# Patient Record
Sex: Female | Born: 1975
Health system: Southern US, Community
[De-identification: ages and names within clinical notes are randomized; demographics above are authoritative.]

## PROBLEM LIST (undated history)

## (undated) DIAGNOSIS — IMO0002 Reserved for concepts with insufficient information to code with codable children: Secondary | ICD-10-CM

## (undated) HISTORY — PX: TONSILLECTOMY AND ADENOIDECTOMY: SUR1326

## (undated) HISTORY — DX: Reserved for concepts with insufficient information to code with codable children: IMO0002

---

## 1998-01-01 ENCOUNTER — Encounter: Admission: RE | Admit: 1998-01-01 | Discharge: 1998-04-01 | Payer: Self-pay | Admitting: Gynecology

## 1998-04-03 ENCOUNTER — Inpatient Hospital Stay (HOSPITAL_COMMUNITY): Admission: AD | Admit: 1998-04-03 | Discharge: 1998-04-05 | Payer: Self-pay | Admitting: Gynecology

## 1998-08-21 ENCOUNTER — Emergency Department (HOSPITAL_COMMUNITY): Admission: EM | Admit: 1998-08-21 | Discharge: 1998-08-21 | Payer: Self-pay | Admitting: Emergency Medicine

## 1998-08-21 ENCOUNTER — Encounter: Payer: Self-pay | Admitting: Emergency Medicine

## 1999-08-13 ENCOUNTER — Inpatient Hospital Stay (HOSPITAL_COMMUNITY): Admission: AD | Admit: 1999-08-13 | Discharge: 1999-08-15 | Payer: Self-pay | Admitting: Gynecology

## 1999-08-16 ENCOUNTER — Encounter: Admission: RE | Admit: 1999-08-16 | Discharge: 1999-10-08 | Payer: Self-pay | Admitting: *Deleted

## 2000-11-15 ENCOUNTER — Other Ambulatory Visit: Admission: RE | Admit: 2000-11-15 | Discharge: 2000-11-15 | Payer: Self-pay | Admitting: Gynecology

## 2001-07-30 ENCOUNTER — Inpatient Hospital Stay (HOSPITAL_COMMUNITY): Admission: AD | Admit: 2001-07-30 | Discharge: 2001-08-02 | Payer: Self-pay | Admitting: Gynecology

## 2001-09-11 ENCOUNTER — Other Ambulatory Visit: Admission: RE | Admit: 2001-09-11 | Discharge: 2001-09-11 | Payer: Self-pay | Admitting: *Deleted

## 2002-11-20 ENCOUNTER — Other Ambulatory Visit: Admission: RE | Admit: 2002-11-20 | Discharge: 2002-11-20 | Payer: Self-pay | Admitting: Gynecology

## 2003-02-07 ENCOUNTER — Ambulatory Visit (HOSPITAL_COMMUNITY): Admission: RE | Admit: 2003-02-07 | Discharge: 2003-02-07 | Payer: Self-pay | Admitting: Gynecology

## 2003-02-07 ENCOUNTER — Ambulatory Visit (HOSPITAL_BASED_OUTPATIENT_CLINIC_OR_DEPARTMENT_OTHER): Admission: RE | Admit: 2003-02-07 | Discharge: 2003-02-07 | Payer: Self-pay | Admitting: Gynecology

## 2003-02-07 ENCOUNTER — Encounter (INDEPENDENT_AMBULATORY_CARE_PROVIDER_SITE_OTHER): Payer: Self-pay | Admitting: Specialist

## 2003-09-17 ENCOUNTER — Encounter: Admission: RE | Admit: 2003-09-17 | Discharge: 2003-12-16 | Payer: Self-pay | Admitting: Gynecology

## 2003-12-29 ENCOUNTER — Inpatient Hospital Stay (HOSPITAL_COMMUNITY): Admission: AD | Admit: 2003-12-29 | Discharge: 2003-12-31 | Payer: Self-pay | Admitting: Gynecology

## 2003-12-29 ENCOUNTER — Encounter (INDEPENDENT_AMBULATORY_CARE_PROVIDER_SITE_OTHER): Payer: Self-pay | Admitting: Specialist

## 2004-02-14 ENCOUNTER — Other Ambulatory Visit: Admission: RE | Admit: 2004-02-14 | Discharge: 2004-02-14 | Payer: Self-pay | Admitting: Gynecology

## 2005-03-15 ENCOUNTER — Other Ambulatory Visit: Admission: RE | Admit: 2005-03-15 | Discharge: 2005-03-15 | Payer: Self-pay | Admitting: Gynecology

## 2006-01-07 ENCOUNTER — Other Ambulatory Visit: Admission: RE | Admit: 2006-01-07 | Discharge: 2006-01-07 | Payer: Self-pay | Admitting: Gynecology

## 2007-01-10 ENCOUNTER — Other Ambulatory Visit: Admission: RE | Admit: 2007-01-10 | Discharge: 2007-01-10 | Payer: Self-pay | Admitting: Gynecology

## 2008-01-15 ENCOUNTER — Other Ambulatory Visit: Admission: RE | Admit: 2008-01-15 | Discharge: 2008-01-15 | Payer: Self-pay | Admitting: Gynecology

## 2008-01-15 ENCOUNTER — Encounter: Payer: Self-pay | Admitting: Gynecology

## 2008-01-15 ENCOUNTER — Ambulatory Visit: Payer: Self-pay | Admitting: Gynecology

## 2008-01-19 ENCOUNTER — Ambulatory Visit: Payer: Self-pay | Admitting: Gynecology

## 2009-01-15 ENCOUNTER — Other Ambulatory Visit: Admission: RE | Admit: 2009-01-15 | Discharge: 2009-01-15 | Payer: Self-pay | Admitting: Gynecology

## 2009-01-15 ENCOUNTER — Encounter: Payer: Self-pay | Admitting: Gynecology

## 2009-01-15 ENCOUNTER — Ambulatory Visit: Payer: Self-pay | Admitting: Gynecology

## 2009-01-22 ENCOUNTER — Ambulatory Visit (HOSPITAL_COMMUNITY): Admission: RE | Admit: 2009-01-22 | Discharge: 2009-01-22 | Payer: Self-pay | Admitting: Gynecology

## 2009-02-14 ENCOUNTER — Ambulatory Visit: Payer: Self-pay | Admitting: Gynecology

## 2009-03-14 ENCOUNTER — Ambulatory Visit: Payer: Self-pay | Admitting: Gynecology

## 2010-01-20 ENCOUNTER — Other Ambulatory Visit: Admission: RE | Admit: 2010-01-20 | Discharge: 2010-01-20 | Payer: Self-pay | Admitting: Gynecology

## 2010-01-20 ENCOUNTER — Ambulatory Visit: Payer: Self-pay | Admitting: Gynecology

## 2010-08-14 NOTE — Discharge Summary (Signed)
[facility]  Patient:    Paige Wise, Paige Wise                       MRN: [id number] Adm. Date:  [id number] Disc. Date: [id number] Attending:  Merrily Pew Dictator:   Antony Contras, RNC, [facility]                           Discharge Summary  DISCHARGE DIAGNOSES:          1. Intrauterine pregnancy at 39-5/7 weeks.                               2. Induction of labor.                               3. History of previous stillborn at 3 weeks.                               4. Second degree to probable cord accident.  PROCEDURE:                    Normal spontaneous vaginal delivery of a viable female infant, nuchal cord x 1, cut on perineum.  Repair of midline episiotomy with second degree extension.  HISTORY OF PRESENT ILLNESS:   The patient is a 35 year old, gravida 3, para 0-1-1, with an LMP of November 08, 1998, [facility] of Aug 15, 1999, by ultrasound.  Prenatal risk factors include history of a 28-week fetal loss secondary to cord accident with  previous pregnancy.  PRENATAL LABORATORY DATA:     Blood type O positive, antibody screen negative, rubella immuned.  RPR, HBSAG, HIV nonreactive.  MSAFP within normal limits. GBS was negative.  HOSPITAL COURSE:              The patient was admitted for induction of labor on Aug 13, 1998, currently at term with a favorable cervical dilatation and due to  history of stillborn, elected for an induction.  Cervix was 3 cm, 80% effaced, nd vertex was -3.  Labor progressed without difficulty to complete dilatation. She was delivered of an Apgars 8 and 8 female infant over a midline episiotomy with second degree laceration.  Weight was 7 pounds 7 ounces.  Nuchal cord x 1 tight  which was cut on the perineum.  Postpartum course, the patient remained afebrile, no difficulty voiding, and was able to be discharged on her second postpartum day in satisfactory condition. Postpartum CBC; hematocrit 39.1,  hemoglobin 13.9, WBC 20.3, platelets 153.  DISPOSITION:                  Follow up in the office in six weeks.  Continue with prenatal vitamins, iron, Motrin, and Tylox for pain. DD:  08/28/99 TD:  08/31/99 Job: [id number]

## 2010-08-14 NOTE — H&P (Signed)
NAME:  Paige Wise, Paige Wise                        ACCOUNT NO.:  [id number]   MEDICAL RECORD NO.:  [id number]                   PATIENT TYPE:  AMB   LOCATION:  [facility]                                 FACILITY:  [facility]   PHYSICIAN:  Juan H. Lily Peer, M.D.             DATE OF BIRTH:  [birth year]   DATE OF ADMISSION:  02/07/2003  DATE OF DISCHARGE:                                HISTORY & PHYSICAL   CHIEF COMPLAINT:  First trimester missed abortion.   HISTORY:  The patient is a 35 year old, gravida 4, para 3, living children  two, and one intrauterine fetal demise at [redacted] weeks gestation to cord  accident in 1999.  After that, she had two normal spontaneous vaginal  deliveries at term.  With one of the pregnancies, she had gestational  diabetes.  The patient was seen for an EOB visit on January 28, 2003, and  based on last menstrual period she would have been approximately 11-1/[redacted]  weeks gestation.  Fetal heart tones were not appreciated, so an ultrasound  was done which demonstrated an empty gestational sac, irregular in shape,  consistent with approximately 5 weeks and 6 days.  No cardiac activity was  noted or a yolk sac was seen.  She did have a quantitative beta hCG done on  that day which had a value of 36,840.  She had a quantitative beta hCG  repeated on January 31, 2003, which was only 27,866.  Today it was only  28,080.  She had another ultrasound done on January 31, 2003, and still  essentially no change and she wanted to have one more today to reassure  herself that she had a missed AB.  The ultrasound today demonstrated a  single gestational sac, irregular shape, no fetal pulse, a small yolk sac  was noted, a thickened endometrial cavity, irregular in shape, and an  echogenic hematoma surrounding the sac measuring 20 x 10 mm.  No cardiac  activity was noted.  The patient had denied any vaginal bleeding.  She is  scheduled to undergo a D&E for a missed AB.   ALLERGIES:  The patient  denies any allergies.   PAST MEDICAL HISTORY:  She had a normal spontaneous vaginal delivery in 1999  of a fetal demise at [redacted] weeks gestation secondary to cord accident.  In 2001  and 2003, she had normal spontaneous vaginal deliveries.  Aside from that,  she has had no major medical problems.  She recently had a hemoglobin A1C  due to the fact that she had gestational diabetes in prior pregnancy.  Her  hemoglobin A1C recently was normal.   PHYSICAL EXAMINATION:  GENERAL APPEARANCE:  A well-developed, well-nourished  female.  HEENT:  Unremarkable.  NECK:  Supple.  Trachea midline.  No carotid bruits.  No thyromegaly.  LUNGS:  Clear to auscultation without rhonchi or wheezes.  HEART:  Regular rate and rhythm.  No murmurs or gallops.  BREASTS:  Not done.  ABDOMEN:  Soft and nontender without rebound or guarding.  PELVIC:  Bartholin's, urethral, and Skene's glands within normal limits.  Vagina and cervix with no lesions or discharge.  The uterus was  approximately 6-8 weeks size with no palpable masses or tenderness.  RECTAL:  Deferred.   LABORATORY DATA:  Her prenatal labs had consisted of a blood type which was  O positive.  Negative antibody screen.  Her VDRL, hepatitis B surface  antigen, and HIV were negative.  Rubella with evidence of immunity.   ASSESSMENT:  A 35 year old, gravida 4, para 3, living children two, now  apparent missed AB.  Scheduled to undergo a D&E at the [facility] on February 07, 2003, at 9:30 a.m.  Please have the history and  physical available.  The patient was counseled as to the risks, benefits,  pros, and cons of D&E to include infection, bleeding, and trauma to internal  organs as a result of perforation during instrumentation requiring open  laparotomy.  In the event of uncontrollable hemorrhage, she is fully aware  that she may need a blood transfusion with its potential risks of  anaphylactic reaction, hepatitis, and AIDS and  additional hospitalization  days in the event that an open laparotomy may need to be utilized.  The  patient will receive prophylaxis antibiotic to prevent infection.  All of  the above was explained to the patient and we will follow according.   PLAN:  The patient is scheduled for surgery on February 07, 2003, at 9:30  a.m. for a D&E.                                               Juan H. Lily Peer, M.D.    JHF/MEDQ  D:  02/05/2003  T:  02/05/2003  Job:  [id number]

## 2010-08-14 NOTE — Op Note (Signed)
   NAME:  Paige Wise, CHICO                        ACCOUNT NO.:  [id number]   MEDICAL RECORD NO.:  [id number]                   PATIENT TYPE:  AMB   LOCATION:  [facility]                                 FACILITY:  [facility]   PHYSICIAN:  Juan H. Lily Peer, M.D.             DATE OF BIRTH:  09/15/1975   DATE OF PROCEDURE:  02/07/2003  DATE OF DISCHARGE:                                 OPERATIVE REPORT   INDICATIONS FOR PROCEDURE:  A 35 year old, gravida 4, para 3, living  children 2, one intrauterine fetal demise at [redacted] weeks gestation due to a  cord accident in 1999. She has had two normal spontaneous vaginal deliveries  at term and now with this pregnancy first trimester missed AB.   PREOPERATIVE DIAGNOSIS:  First trimester missed abortion.   POSTOPERATIVE DIAGNOSIS:  First trimester missed abortion.   ANESTHESIA:  General endotracheal anesthesia.   SURGEON:  Juan H. Lily Peer, M.D.   PROCEDURE:  Suction D&E.   DESCRIPTION OF PROCEDURE:  After the patient was adequately counseled, she  was taken to the operating room where she underwent a successful general  endotracheal anesthesia. She received a gram of Cefotan prophylactically.  The vagina and perineum were prepped and draped in the usual sterile  fashion. A red rubber Roxan Hockey was utilized to evacuate the bladder, his  contents were less than 50 mL. Bimanual examination demonstrated a uterus  approximately 8-10 weeks size, no palpable adnexal masses. A Graves speculum  was inserted into the vaginal vault. The cervix was grasped with a single  tooth tenaculum and the cervix was serially dilated with Shawnie Pons dilators in  an effort to allow an 8 mm suction curette to be introduced into the  intrauterine cavity to remove the products of conception. This was  interchanged with a small serrated curette to remove the products of  conception. The single tooth tenaculum was removed, silver nitrate was used  for hemostasis at the puncture site from  the single tooth tenaculum. The  patient was extubated, transferred to the recovery room with stable vital  signs. She did receive 10 units of Pitocin per 1 liter of lactated Ringers  and she will receive 30 mg of Toradol IV in route to the recovery room. Her  blood type is O positive.                                               Juan H. Lily Peer, M.D.    JHF/MEDQ  D:  02/07/2003  T:  02/07/2003  Job:  [id number]

## 2010-08-14 NOTE — Discharge Summary (Signed)
[facility]  Patient:    Paige Wise, MARXEN Visit Number: [id number] MRN: [id number]          Service Type: OBS Location: 910A 9146 01 Attending Physician:  Wetzel Bjornstad Dictated by:   Antony Contras, [facility] Admit Date:  07/30/2001 Discharge Date: 08/02/2001                             Discharge Summary  DISCHARGE DIAGNOSES:          1. Intrauterine pregnancy at term.                               2. Spontaneous onset of labor.                               3. Inefficient pushing.  PROCEDURE:                    Outlet vacuum-assisted vaginal delivery with delivery of a viable infant.  HISTORY OF PRESENT ILLNESS:   The patient is a 35 year old gravida 3, para 1-0-1-1, LMP October 28, 2000, [facility] Aug 04, 2001.  Prenatal risk factors include previous history of intrauterine fetal demise at 28 weeks first pregnancy.  LABORATORY DATA:              Blood type O+, antibody screen negative.  RPR, HBsAg, HIV nonreactive.  HOSPITAL COURSE AND TREATMENT:                    The patient was admitted at [redacted] weeks gestation with spontaneous onset of labor.  She progressed to complete dilatation.  Due to ineffective pushing, delivery was accomplished outlet vacuum assistance. Delivered an Apgar 23 and 47 female infant weighing 7 pounds and 10 ounces over an intact perineum with repair of second-degree laceration.  Postpartum course, she remained afebrile.  She had no difficulty voiding.  She was able to be discharged in satisfactory condition on her second postpartum day.  CBC: Hematocrit 33.4, hemoglobin 11.3, WBC 13.1, platelets 156.  DISPOSITION:                  Follow up in six weeks.  Continue with prenatal vitamins with iron and Motrin for pain. Dictated by:   Antony Contras, [facility] Attending Physician:  Wetzel Bjornstad DD:  08/17/01 TD:  08/20/01 Job: [id number]

## 2011-01-21 ENCOUNTER — Encounter: Payer: Self-pay | Admitting: Anesthesiology

## 2011-01-27 ENCOUNTER — Ambulatory Visit (INDEPENDENT_AMBULATORY_CARE_PROVIDER_SITE_OTHER): Payer: Managed Care, Other (non HMO) | Admitting: Gynecology

## 2011-01-27 ENCOUNTER — Other Ambulatory Visit (HOSPITAL_COMMUNITY)
Admission: RE | Admit: 2011-01-27 | Discharge: 2011-01-27 | Disposition: A | Discharge status: Home / Self Care | Payer: Managed Care, Other (non HMO) | Source: Ambulatory Visit | Attending: Gynecology | Admitting: Gynecology

## 2011-01-27 ENCOUNTER — Encounter: Payer: Self-pay | Admitting: Gynecology

## 2011-01-27 VITALS — BP 110/72 | Ht 59.25 in | Wt 134.0 lb

## 2011-01-27 DIAGNOSIS — Z8632 Personal history of gestational diabetes: Secondary | ICD-10-CM

## 2011-01-27 DIAGNOSIS — R82998 Other abnormal findings in urine: Secondary | ICD-10-CM

## 2011-01-27 DIAGNOSIS — Z01419 Encounter for gynecological examination (general) (routine) without abnormal findings: Secondary | ICD-10-CM | POA: Insufficient documentation

## 2011-01-27 DIAGNOSIS — R635 Abnormal weight gain: Secondary | ICD-10-CM

## 2011-01-27 NOTE — Progress Notes (Signed)
Paige Wise [birth year] [id number]   History:    35 y.o.  for annual exam with no complaints today. Patient with prior history of gestational diabetes. Patient has a ParaGard T380A IUD. Patient is having normal menstrual cycles. Patient does her monthly self breast examinations. Her last mammogram was in 2010 which was normal.  Past medical history,surgical history, family history and social history were all reviewed and documented in the EPIC chart.  ROS:  Was performed and pertinent positives and negatives are included in the history.  Exam: chaperone present BP 110/72  Ht 4' 11.25" (1.505 m)  Wt 134 lb (60.782 kg)  BMI 26.84 kg/m2  LMP 01/21/2011  Body mass index is 26.84 kg/(m^2).  General appearance : Well developed well nourished female. No acute distress HEENT: Neck supple, trachea midline, no carotid bruits, no thyroidmegaly Lungs: Clear to auscultation, no rhonchi or wheezes, or rib retractions  Heart: Regular rate and rhythm, no murmurs or gallops Breast:Examined in sitting and supine position were symmetrical in appearance, no palpable masses or tenderness,  no skin retraction, no nipple inversion, no nipple discharge, no skin discoloration, no axillary or supraclavicular lymphadenopathy Abdomen: no palpable masses or tenderness, no rebound or guarding Extremities: no edema or skin discoloration or tenderness  Pelvic:  Bartholin, Urethra, Skene Glands: Within normal limits             Vagina: No gross lesions or discharge  Cervix: No gross lesions or discharge IUD string seen  Uterus  anteverted, normal size, shape and consistency, non-tender and mobile  Adnexa  Without masses or tenderness  Anus and perineum  normal   Rectovaginal  normal sphincter tone without palpated masses or tenderness             Hemoccult not done     Assessment/Plan:  35 y.o. female for annual exam unremarkable. The following labs were drawn today CBC, cholesterol, urinalysis, TSH, random  blood sugar and Pap smear. She was instructed to continue to do her monthly self breast examination to take calcium and vitamin D for osteoporosis prevention engage in exercise program 3 or 4 times a week. We'll see her in one year or when necessary.    Ok Edwards MD, 10:58 AM 01/27/2011

## 2011-01-28 ENCOUNTER — Other Ambulatory Visit: Payer: Self-pay | Admitting: *Deleted

## 2011-01-28 DIAGNOSIS — R8271 Bacteriuria: Secondary | ICD-10-CM

## 2011-01-29 ENCOUNTER — Ambulatory Visit (INDEPENDENT_AMBULATORY_CARE_PROVIDER_SITE_OTHER): Payer: Managed Care, Other (non HMO) | Admitting: Gynecology

## 2011-01-29 DIAGNOSIS — R8271 Bacteriuria: Secondary | ICD-10-CM

## 2011-01-29 DIAGNOSIS — R82998 Other abnormal findings in urine: Secondary | ICD-10-CM

## 2011-02-02 ENCOUNTER — Other Ambulatory Visit: Payer: Self-pay | Admitting: *Deleted

## 2011-02-02 DIAGNOSIS — N390 Urinary tract infection, site not specified: Secondary | ICD-10-CM

## 2011-02-02 MED ORDER — NITROFURANTOIN MONOHYD MACRO 100 MG PO CAPS: 100.0000 mg | ORAL_CAPSULE | Freq: Two times a day (BID) | ORAL | Status: AC

## 2012-01-31 ENCOUNTER — Encounter: Payer: Managed Care, Other (non HMO) | Admitting: Gynecology

## 2012-02-02 ENCOUNTER — Ambulatory Visit (INDEPENDENT_AMBULATORY_CARE_PROVIDER_SITE_OTHER): Payer: Managed Care, Other (non HMO) | Admitting: Gynecology

## 2012-02-02 ENCOUNTER — Encounter: Payer: Self-pay | Admitting: Gynecology

## 2012-02-02 VITALS — BP 116/78 | Ht 59.25 in | Wt 136.0 lb

## 2012-02-02 DIAGNOSIS — Z23 Encounter for immunization: Secondary | ICD-10-CM

## 2012-02-02 DIAGNOSIS — R635 Abnormal weight gain: Secondary | ICD-10-CM | POA: Insufficient documentation

## 2012-02-02 DIAGNOSIS — Z01419 Encounter for gynecological examination (general) (routine) without abnormal findings: Secondary | ICD-10-CM

## 2012-02-02 DIAGNOSIS — Z8632 Personal history of gestational diabetes: Secondary | ICD-10-CM

## 2012-02-02 LAB — CBC WITH DIFFERENTIAL/PLATELET
Basophils Absolute: 0 10*3/uL (ref 0.0–0.1)
Basophils Relative: 0 % (ref 0–1)
HCT: 40.6 % (ref 36.0–46.0)
MCHC: 33.5 g/dL (ref 30.0–36.0)
Monocytes Absolute: 0.7 10*3/uL (ref 0.1–1.0)
Neutro Abs: 6.1 10*3/uL (ref 1.7–7.7)
Platelets: 251 10*3/uL (ref 150–400)
RDW: 13.3 % (ref 11.5–15.5)
WBC: 9.2 10*3/uL (ref 4.0–10.5)

## 2012-02-02 LAB — HEMOGLOBIN A1C: Mean Plasma Glucose: 114 mg/dL (ref <117)

## 2012-02-02 NOTE — Patient Instructions (Signed)

## 2012-02-02 NOTE — Progress Notes (Signed)
Paige Wise 07/19/1975 [id number]   History:    36 y.o.  for annual gyn exam with no complaints today. Review of patient's records indicated she has history of gestational diabetes in the past. She has a ParaGard T380A IUD that was placed in November 2010. Her cycles are reported to be normal. She does her monthly self breast examination. Her Pap smears have always been normal the last one being in 2012.  Past medical history,surgical history, family history and social history were all reviewed and documented in the EPIC chart.  Gynecologic History Patient's last menstrual period was 01/07/2012. Contraception: IUD Last Pap: 2012. Results were: normal Last mammogram: Not indicated. Results were: Not indicated  Obstetric History OB History    Grav Para Term Preterm Abortions TAB SAB Ect Mult Living   5 3 3  2  2   3      # Outc Date GA Lbr Len/2nd Wgt Sex Del Anes PTL Lv   1 TRM     M SVD   Yes   2 TRM     F SVD   Yes   3 TRM     F SVD   Yes   4 SAB            5 SAB                ROS: A ROS was performed and pertinent positives and negatives are included in the history.  GENERAL: No fevers or chills. HEENT: No change in vision, no earache, sore throat or sinus congestion. NECK: No pain or stiffness. CARDIOVASCULAR: No chest pain or pressure. No palpitations. PULMONARY: No shortness of breath, cough or wheeze. GASTROINTESTINAL: No abdominal pain, nausea, vomiting or diarrhea, melena or bright red blood per rectum. GENITOURINARY: No urinary frequency, urgency, hesitancy or dysuria. MUSCULOSKELETAL: No joint or muscle pain, no back pain, no recent trauma. DERMATOLOGIC: No rash, no itching, no lesions. ENDOCRINE: No polyuria, polydipsia, no heat or cold intolerance. No recent change in weight. HEMATOLOGICAL: No anemia or easy bruising or bleeding. NEUROLOGIC: No headache, seizures, numbness, tingling or weakness. PSYCHIATRIC: No depression, no loss of interest in normal activity or change  in sleep pattern.     Exam: chaperone present  BP 116/78  Ht 4' 11.25" (1.505 m)  Wt 136 lb (61.689 kg)  BMI 27.24 kg/m2  LMP 01/07/2012  Body mass index is 27.24 kg/(m^2).  General appearance : Well developed well nourished female. No acute distress HEENT: Neck supple, trachea midline, no carotid bruits, no thyroidmegaly Lungs: Clear to auscultation, no rhonchi or wheezes, or rib retractions  Heart: Regular rate and rhythm, no murmurs or gallops Breast:Examined in sitting and supine position were symmetrical in appearance, no palpable masses or tenderness,  no skin retraction, no nipple inversion, no nipple discharge, no skin discoloration, no axillary or supraclavicular lymphadenopathy Abdomen: no palpable masses or tenderness, no rebound or guarding Extremities: no edema or skin discoloration or tenderness  Pelvic:  Bartholin, Urethra, Skene Glands: Within normal limits             Vagina: No gross lesions or discharge  Cervix: No gross lesions or discharge  Uterus  anteverted, normal size, shape and consistency, non-tender and mobile  Adnexa  Without masses or tenderness  Anus and perineum  normal   Rectovaginal  normal sphincter tone without palpated masses or tenderness             Hemoccult not done     Assessment/Plan:  36 y.o. female for annual exam with no abnormalities detected. We discussed the new Pap smear screening guidelines. No Pap smear done today. The following labs were ordered today: Hemoglobin A1c, cholesterol, CBC, urinalysis and TSH. She was to receive the flu vaccine in the Tdap vaccine. She was counseled and literature information was provided. She was reminded to do her monthly self breast examination. We discussed importance of calcium vitamin D for osteoporosis prevention.    Ok Edwards MD, 11:09 AM 02/02/2012

## 2012-02-03 LAB — URINALYSIS W MICROSCOPIC + REFLEX CULTURE
Bacteria, UA: NONE SEEN
Casts: NONE SEEN
Glucose, UA: NEGATIVE mg/dL
Hgb urine dipstick: NEGATIVE
Ketones, ur: NEGATIVE mg/dL
Nitrite: NEGATIVE
Protein, ur: NEGATIVE mg/dL
pH: 7.5 (ref 5.0–8.0)

## 2012-02-03 LAB — TSH: TSH: 1.898 u[IU]/mL (ref 0.350–4.500)

## 2012-02-03 LAB — CHOLESTEROL, TOTAL: Cholesterol: 121 mg/dL (ref 0–200)

## 2012-02-05 LAB — URINE CULTURE: Colony Count: >=100000

## 2012-02-09 ENCOUNTER — Other Ambulatory Visit: Payer: Self-pay | Admitting: Gynecology

## 2012-02-09 MED ORDER — NITROFURANTOIN MONOHYD MACRO 100 MG PO CAPS: 100.0000 mg | ORAL_CAPSULE | Freq: Two times a day (BID) | ORAL | Status: DC

## 2012-12-28 ENCOUNTER — Ambulatory Visit (INDEPENDENT_AMBULATORY_CARE_PROVIDER_SITE_OTHER): Payer: Managed Care, Other (non HMO) | Admitting: Anesthesiology

## 2012-12-28 DIAGNOSIS — Z23 Encounter for immunization: Secondary | ICD-10-CM

## 2013-02-05 ENCOUNTER — Encounter: Payer: Self-pay | Admitting: Gynecology

## 2013-02-05 ENCOUNTER — Ambulatory Visit (INDEPENDENT_AMBULATORY_CARE_PROVIDER_SITE_OTHER): Payer: Commercial Managed Care - PPO | Admitting: Gynecology

## 2013-02-05 VITALS — BP 120/74 | Ht 59.5 in | Wt 131.0 lb

## 2013-02-05 DIAGNOSIS — Z01419 Encounter for gynecological examination (general) (routine) without abnormal findings: Secondary | ICD-10-CM

## 2013-02-05 LAB — CBC WITH DIFFERENTIAL/PLATELET
Basophils Absolute: 0 10*3/uL (ref 0.0–0.1)
Basophils Relative: 1 % (ref 0–1)
Eosinophils Absolute: 0.1 10*3/uL (ref 0.0–0.7)
MCH: 29.6 pg (ref 26.0–34.0)
MCHC: 34.1 g/dL (ref 30.0–36.0)
Neutrophils Relative %: 57 % (ref 43–77)
Platelets: 217 10*3/uL (ref 150–400)
RDW: 13.6 % (ref 11.5–15.5)

## 2013-02-05 LAB — COMPREHENSIVE METABOLIC PANEL
ALT: 16 U/L (ref 0–35)
Alkaline Phosphatase: 53 U/L (ref 39–117)
Glucose, Bld: 77 mg/dL (ref 70–99)
Sodium: 139 mEq/L (ref 135–145)
Total Bilirubin: 0.5 mg/dL (ref 0.3–1.2)
Total Protein: 6.7 g/dL (ref 6.0–8.3)

## 2013-02-05 LAB — TSH: TSH: 2.155 u[IU]/mL (ref 0.350–4.500)

## 2013-02-05 LAB — CHOLESTEROL, TOTAL: Cholesterol: 104 mg/dL (ref 0–200)

## 2013-02-05 NOTE — Progress Notes (Signed)
Paige Wise Aug 09, 1975 [id number]   History:    37 y.o.  for annual gyn exam with no complaints today. Patient has a ParaGard T380A IUD placement 2010. Patient would know prior history of abnormal Pap smear. Patient reports normal menstrual cycles. Patient had flu vaccine this year. Patient with past history of gestational diabetes.  Past medical history,surgical history, family history and social history were all reviewed and documented in the EPIC chart.  Gynecologic History Patient's last menstrual period was 01/20/2013. Contraception: IUD Last Pap: 2012. Results were: normal Last mammogram: none indicated. Results were: none indicated  Obstetric History OB History  Gravida Para Term Preterm AB SAB TAB Ectopic Multiple Living  5 3 3  2 2    3     # Outcome Date GA Lbr Len/2nd Weight Sex Delivery Anes PTL Lv  5 SAB           4 SAB           3 TRM     F SVD   Y  2 TRM     F SVD   Y  1 TRM     M SVD   Y       ROS: A ROS was performed and pertinent positives and negatives are included in the history.  GENERAL: No fevers or chills. HEENT: No change in vision, no earache, sore throat or sinus congestion. NECK: No pain or stiffness. CARDIOVASCULAR: No chest pain or pressure. No palpitations. PULMONARY: No shortness of breath, cough or wheeze. GASTROINTESTINAL: No abdominal pain, nausea, vomiting or diarrhea, melena or bright red blood per rectum. GENITOURINARY: No urinary frequency, urgency, hesitancy or dysuria. MUSCULOSKELETAL: No joint or muscle pain, no back pain, no recent trauma. DERMATOLOGIC: No rash, no itching, no lesions. ENDOCRINE: No polyuria, polydipsia, no heat or cold intolerance. No recent change in weight. HEMATOLOGICAL: No anemia or easy bruising or bleeding. NEUROLOGIC: No headache, seizures, numbness, tingling or weakness. PSYCHIATRIC: No depression, no loss of interest in normal activity or change in sleep pattern.     Exam: chaperone present  BP 120/74  Ht 4'  11.5" (1.511 m)  Wt 131 lb (59.421 kg)  BMI 26.03 kg/m2  LMP 01/20/2013  Body mass index is 26.03 kg/(m^2).  General appearance : Well developed well nourished female. No acute distress HEENT: Neck supple, trachea midline, no carotid bruits, no thyroidmegaly Lungs: Clear to auscultation, no rhonchi or wheezes, or rib retractions  Heart: Regular rate and rhythm, no murmurs or gallops Breast:Examined in sitting and supine position were symmetrical in appearance, no palpable masses or tenderness,  no skin retraction, no nipple inversion, no nipple discharge, no skin discoloration, no axillary or supraclavicular lymphadenopathy Abdomen: no palpable masses or tenderness, no rebound or guarding Extremities: no edema or skin discoloration or tenderness  Pelvic:  Bartholin, Urethra, Skene Glands: Within normal limits             Vagina: No gross lesions or discharge  Cervix: No gross lesions or discharge, IUD string seen  Uterus  anteverted, normal size, shape and consistency, non-tender and mobile  Adnexa  Without masses or tenderness  Anus and perineum  normal   Rectovaginal  normal sphincter tone without palpated masses or tenderness             Hemoccult none indicated     Assessment/Plan:  37 y.o. female for annual exam with no abnormalities detected. Patient doing well with the ParaGard T380A IUD. Patient had flu vaccine this  year. Pap smear was not done today in accordance with a new guidelines. The following labs were ordered: CBC, comprehensive metabolic panel, TSH, screening cholesterol, and urinalysis.  Note: This dictation was prepared with  Dragon/digital dictation along withSmart phrase technology. Any transcriptional errors that result from this process are unintentional.   Ok Edwards MD, 11:15 AM 02/05/2013

## 2013-02-06 LAB — URINALYSIS W MICROSCOPIC + REFLEX CULTURE
Bilirubin Urine: NEGATIVE
Ketones, ur: NEGATIVE mg/dL
Nitrite: NEGATIVE
Protein, ur: NEGATIVE mg/dL
Urobilinogen, UA: 0.2 mg/dL (ref 0.0–1.0)
pH: 7.5 (ref 5.0–8.0)

## 2013-02-08 ENCOUNTER — Other Ambulatory Visit: Payer: Self-pay | Admitting: Gynecology

## 2013-02-08 LAB — URINE CULTURE

## 2013-02-08 MED ORDER — NITROFURANTOIN MONOHYD MACRO 100 MG PO CAPS: 100.0000 mg | ORAL_CAPSULE | Freq: Two times a day (BID) | ORAL | Status: DC

## 2013-05-31 ENCOUNTER — Ambulatory Visit: Payer: Commercial Managed Care - PPO | Admitting: Gynecology

## 2013-06-01 ENCOUNTER — Encounter: Payer: Self-pay | Admitting: Gynecology

## 2013-06-01 ENCOUNTER — Ambulatory Visit (INDEPENDENT_AMBULATORY_CARE_PROVIDER_SITE_OTHER): Payer: Commercial Managed Care - PPO | Admitting: Gynecology

## 2013-06-01 VITALS — BP 124/76

## 2013-06-01 DIAGNOSIS — N644 Mastodynia: Secondary | ICD-10-CM

## 2013-06-01 NOTE — Progress Notes (Signed)
   Patient is a 38 year old who presented to the office today complaining of left breast tenderness which she states has occurred over the past 2 months the week before her menses. Patient denies any nipple discharge or any masses. She denies any recent injury. She does drink one cup T. daily. Patient with no family history of breast cancer. Patient had a normal baseline mammogram at the age of 38.  Exam: Both breasts were examined in the sitting and supine positions. Both breasts are symmetrical in appearance. No skin discoloration no palpable masses or tenderness and no supraclavicular or axillary lymphadenopathy.  Assessment/plan: Premenstrual mastodynia. Will proceed with scheduling her screening mammogram since she is now near age 38. I have offered her to take over-the-counter vitamin E 600 units daily and to eliminate any caffeine-containing products the week before her menses. Patient scheduled to return back at the end of the year for her annual exam or when necessary.

## 2013-06-01 NOTE — Patient Instructions (Signed)
Sensibilidad en las mamas  (Breast Tenderness)  La sensibilidad en las mamas es un problema frecuente en las mujeres de todas las edades. y puede causar molestias leves o dolor intenso. Sus causas son variadas. Su mdico determinar la causa probable de la sensibilidad mediante el examen de las mamas, las preguntas sobre los sntomas y la indicacin de algunos estudios. Por lo general, la sensibilidad en las mamas no significa que tenga cncer de mama.  INSTRUCCIONES PARA EL CUIDADO EN EL HOGAR   A menudo, la sensibilidad en las mamas puede tratarse en el hogar. Puede intentar lo siguiente:   Probarse un nuevo sostn que le brinde ms sujecin, especialmente mientras hace actividad fsica.   Usar un sostn con mejor sujecin o uno deportivo mientras duerme cuando las mamas estn muy sensibles.   Si tiene una lesin mamaria, aplique hielo en la zona:   Ponga el hielo en una bolsa plstica.   Colquese una toalla entre la piel y la bolsa de hielo.   Deje el hielo durante 20 minutos y aplquelo 2 a 3 veces por da.   Si tiene las mamas repletas de leche debido a la lactancia, intente lo siguiente:   Extrigase leche manualmente o con un sacaleche.   Aplquese una compresa tibia en las mamas para ayudar a la descarga.   Tome analgsicos de venta libre si su mdico lo autoriza.   Tome otros medicamentos que su mdico le recete, entre ellos, antibiticos o anticonceptivos.  A largo plazo, puede aliviar la sensibilidad en las mamas si hace lo siguiente:   Disminuye el consumo de cafena.   Disminuye la cantidad de grasa de la dieta.  Lleva un registro de los das y las horas cuando tiene mayor sensibilidad en las mamas. Esto ser de ayuda para que usted y su mdico encuentren la causa de la sensibilidad y cmo aliviarla. Adems, aprenda cmo examinarse las mamas en casa. Esto la ayudar a palpar un crecimiento o un bulto fuera de lo normal que podra causar la sensibilidad.  SOLICITE ATENCIN MDICA SI:     Cualquier zona de la mama est dura, enrojecida y caliente al tacto. Puede ser un signo de infeccin.   Hay secrecin de los pezones (y no est amamantando). En especial, vigile la secrecin de sangre o pus.   Tiene fiebre, adems de sensibilidad en las mamas.   Tiene un bulto nuevo o doloroso en la mama que no desaparece despus de la finalizacin del perodo menstrual.   Ha intentando controlar el dolor en casa, pero no desaparece.   El dolor de la mama es ms intenso o le dificulta hacer las cosas que hace habitualmente durante el da.  Document Released: 01/03/2013  [employer] Patient Information 2014 [employer].

## 2013-10-29 ENCOUNTER — Other Ambulatory Visit: Payer: Self-pay | Admitting: Gynecology

## 2013-10-29 DIAGNOSIS — Z1231 Encounter for screening mammogram for malignant neoplasm of breast: Secondary | ICD-10-CM

## 2013-11-02 ENCOUNTER — Ambulatory Visit (HOSPITAL_COMMUNITY)
Admission: RE | Admit: 2013-11-02 | Discharge: 2013-11-02 | Disposition: A | Discharge status: Home / Self Care | Payer: Managed Care, Other (non HMO) | Source: Ambulatory Visit | Attending: Gynecology | Admitting: Gynecology

## 2013-11-02 DIAGNOSIS — Z1231 Encounter for screening mammogram for malignant neoplasm of breast: Secondary | ICD-10-CM

## 2014-01-28 ENCOUNTER — Encounter: Payer: Self-pay | Admitting: Gynecology

## 2014-04-03 ENCOUNTER — Ambulatory Visit (INDEPENDENT_AMBULATORY_CARE_PROVIDER_SITE_OTHER): Payer: Medicaid Other | Admitting: Gynecology

## 2014-04-03 ENCOUNTER — Encounter: Payer: Self-pay | Admitting: Gynecology

## 2014-04-03 ENCOUNTER — Other Ambulatory Visit (HOSPITAL_COMMUNITY)
Admission: RE | Admit: 2014-04-03 | Discharge: 2014-04-03 | Disposition: A | Discharge status: Home / Self Care | Payer: Managed Care, Other (non HMO) | Source: Ambulatory Visit | Attending: Gynecology | Admitting: Gynecology

## 2014-04-03 VITALS — BP 122/80 | Ht 59.75 in | Wt 137.0 lb

## 2014-04-03 DIAGNOSIS — Z01419 Encounter for gynecological examination (general) (routine) without abnormal findings: Secondary | ICD-10-CM

## 2014-04-03 DIAGNOSIS — Z309 Encounter for contraceptive management, unspecified: Secondary | ICD-10-CM | POA: Diagnosis not present

## 2014-04-03 DIAGNOSIS — Z1151 Encounter for screening for human papillomavirus (HPV): Secondary | ICD-10-CM | POA: Insufficient documentation

## 2014-04-03 NOTE — Progress Notes (Signed)
Paige CarnesRosa M Wise Jan 13, 1976 [id number]   History:    39 y.o.  for annual gyn exam with no complaints today. Patient is having normal menstrual cycles lasting for 5 days. Patient had a ParaGard T380A IUD placed in 2010. Patient with no past history of any abnormal Pap smear. Age and had a normal mammogram 2015 in does her monthly breast exams.  Past medical history,surgical history, family history and social history were all reviewed and documented in the EPIC chart.  Gynecologic History Patient's last menstrual period was 03/25/2014. Contraception: IUD Last Pap: 2012. Results were: normal Last mammogram: 2015. Results were: normal  Obstetric History OB History  Gravida Para Term Preterm AB SAB TAB Ectopic Multiple Living  5 3 3  2 2    3     # Outcome Date GA Lbr Len/2nd Weight Sex Delivery Anes PTL Lv  5 SAB           4 SAB           3 Term     F Vag-Spont   Y  2 Term     F Vag-Spont   Y  1 Term     M Vag-Spont   Y       ROS: A ROS was performed and pertinent positives and negatives are included in the history.  GENERAL: No fevers or chills. HEENT: No change in vision, no earache, sore throat or sinus congestion. NECK: No pain or stiffness. CARDIOVASCULAR: No chest pain or pressure. No palpitations. PULMONARY: No shortness of breath, cough or wheeze. GASTROINTESTINAL: No abdominal pain, nausea, vomiting or diarrhea, melena or bright red blood per rectum. GENITOURINARY: No urinary frequency, urgency, hesitancy or dysuria. MUSCULOSKELETAL: No joint or muscle pain, no back pain, no recent trauma. DERMATOLOGIC: No rash, no itching, no lesions. ENDOCRINE: No polyuria, polydipsia, no heat or cold intolerance. No recent change in weight. HEMATOLOGICAL: No anemia or easy bruising or bleeding. NEUROLOGIC: No headache, seizures, numbness, tingling or weakness. PSYCHIATRIC: No depression, no loss of interest in normal activity or change in sleep pattern.     Exam: chaperone present  BP  122/80 mmHg  Ht 4' 11.75" (1.518 m)  Wt 137 lb (62.143 kg)  BMI 26.97 kg/m2  LMP 03/25/2014  Body mass index is 26.97 kg/(m^2).  General appearance : Well developed well nourished female. No acute distress HEENT: Neck supple, trachea midline, no carotid bruits, no thyroidmegaly Lungs: Clear to auscultation, no rhonchi or wheezes, or rib retractions  Heart: Regular rate and rhythm, no murmurs or gallops Breast:Examined in sitting and supine position were symmetrical in appearance, no palpable masses or tenderness,  no skin retraction, no nipple inversion, no nipple discharge, no skin discoloration, no axillary or supraclavicular lymphadenopathy Abdomen: no palpable masses or tenderness, no rebound or guarding Extremities: no edema or skin discoloration or tenderness  Pelvic:  Bartholin, Urethra, Skene Glands: Within normal limits             Vagina: No gross lesions or discharge  Cervix: No gross lesions or discharge, IUD string seen  Uterus  anteverted, normal size, shape and consistency, non-tender and mobile  Adnexa  Without masses or tenderness  Anus and perineum  normal   Rectovaginal  normal sphincter tone without palpated masses or tenderness             Hemoccult not indicated     Assessment/Plan:  39 y.o. female for annual exam will return back to the office in a few  weeks for her fasting blood work. Pap smear was done today. We discussed importance of monthly breast exam. We discussed importance of good nutrition and regular exercise. Patient will get her flu vaccine in her pharmacy since our supply has ran out.   Ok Edwards MD, 9:20 AM 04/03/2014

## 2014-04-03 NOTE — Addendum Note (Signed)
Addended by: Berna SpareASTILLO, BLANCA A on: 04/03/2014 09:31 AM   Modules accepted: Orders, SmartSet

## 2014-04-03 NOTE — Patient Instructions (Signed)
Influenza Virus Vaccine injection (Fluarix) Qu es este medicamento? La VACUNA ANTIGRIPAL ayuda a disminuir el riesgo de contraer la influenza, tambin conocida como la gripe. La vacuna solo ayuda a protegerle contra algunas cepas de influenza. Esta vacuna no ayuda a reducir el riesgo de contraer influenza pandmica H1N1. Este medicamento puede ser utilizado para otros usos; si tiene alguna pregunta consulte con su proveedor de atencin mdica o con su farmacutico. MARCAS COMERCIALES DISPONIBLES: Fluarix, Fluzone Qu le debo informar a mi profesional de la salud antes de tomar este medicamento? Necesita saber si usted presenta alguno de los siguientes problemas o situaciones: -trastorno de sangrado como hemofilia -fiebre o infeccin -sndrome de Guillain-Barre u otros problemas neurolgicos -problemas del sistema inmunolgico -infeccin por el virus de la inmunodeficiencia humana (VIH) o SIDA -niveles bajos de plaquetas en la sangre -esclerosis mltiple -una reaccin alrgica o inusual a las vacunas antigripales, a los huevos, protenas de pollo, al ltex, a la gentamicina, a otros medicamentos, alimentos, colorantes o conservantes -si est embarazada o buscando quedar embarazada -si est amamantando a un beb Cmo debo utilizar este medicamento? Esta vacuna se administra mediante inyeccin por va intramuscular. Lo administra un profesional de la salud. Recibir una copia de informacin escrita sobre la vacuna antes de cada vacuna. Asegrese de leer este folleto cada vez cuidadosamente. Este folleto puede cambiar con frecuencia. Hable con su pediatra para informarse acerca del uso de este medicamento en nios. Puede requerir atencin especial. Sobredosis: Pngase en contacto inmediatamente con un centro toxicolgico o una sala de urgencia si usted cree que haya tomado demasiado medicamento. ATENCIN: Este medicamento es solo para usted. No comparta este medicamento con nadie. Qu sucede  si me olvido de una dosis? No se aplica en este caso. Qu puede interactuar con este medicamento? -quimioterapia o radioterapia -medicamentos que suprimen el sistema inmunolgico, tales como etanercept, anakinra, infliximab y adalimumab -medicamentos que tratan o previenen cogulos sanguneos, como warfarina -fenitona -medicamentos esteroideos, como la prednisona o la cortisona -teofilina -vacunas Puede ser que esta lista no menciona todas las posibles interacciones. Informe a su profesional de la salud de todos los productos a base de hierbas, medicamentos de venta libre o suplementos nutritivos que est tomando. Si usted fuma, consume bebidas alcohlicas o si utiliza drogas ilegales, indqueselo tambin a su profesional de la salud. Algunas sustancias pueden interactuar con su medicamento. A qu debo estar atento al usar este medicamento? Informe a su mdico o a su profesional de la salud sobre todos los efectos secundarios que persistan despus de 3 das. Llame a su proveedor de atencin mdica si se presentan sntomas inusuales dentro de las 6 semanas posteriores a la vacunacin. Es posible que todava pueda contraer la gripe, pero la enfermedad no ser tan fuerte como normalmente. No puede contraer la gripe de esta vacuna. La vacuna antigripal no le protege contra resfros u otras enfermedades que pueden causar fiebre. Debe vacunarse cada ao. Qu efectos secundarios puedo tener al utilizar este medicamento? Efectos secundarios que debe informar a su mdico o a su profesional de la salud tan pronto como sea posible: -reacciones alrgicas como erupcin cutnea, picazn o urticarias, hinchazn de la cara, labios o lengua Efectos secundarios que, por lo general, no requieren atencin mdica (debe informarlos a su mdico o a su profesional de la salud si persisten o si son molestos): -fiebre -dolor de cabeza -molestias y dolores musculares -dolor, sensibilidad, enrojecimiento o hinchazn en  el lugar de la inyeccin -cansancio o debilidad Puede ser que esta lista   no menciona todos los posibles efectos secundarios. Comunquese a su mdico por asesoramiento mdico sobre los efectos secundarios. Usted puede informar los efectos secundarios a la FDA por telfono al 1-800-FDA-1088. Dnde debo guardar mi medicina? Esta vacuna se administra solamente en clnicas, farmacias, consultorio mdico u otro consultorio de un profesional de la salud y no necesitar guardarlo en su domicilio. ATENCIN: Este folleto es un resumen. Puede ser que no cubra toda la posible informacin. Si usted tiene preguntas acerca de esta medicina, consulte con su mdico, su farmacutico o su profesional de la salud.  2015, Elsevier/Gold Standard. (2009-09-16 15:31:40)  

## 2014-04-04 LAB — CYTOLOGY - PAP

## 2015-03-19 ENCOUNTER — Telehealth: Payer: Self-pay | Admitting: *Deleted

## 2015-03-19 NOTE — Telephone Encounter (Signed)
Pt called c/o rash between fingers and toes. States was treated with cream by Dr Lily PeerFernandez several years ago and wants another prescription for that. I advised that I did not see that information in her medical record and advised urgent care visit or offered a visit on Friday.  She said she would call back if wants an appointment. KW CMA

## 2015-04-10 ENCOUNTER — Encounter: Payer: Self-pay | Admitting: Gynecology

## 2015-04-10 ENCOUNTER — Ambulatory Visit (INDEPENDENT_AMBULATORY_CARE_PROVIDER_SITE_OTHER): Payer: BLUE CROSS/BLUE SHIELD | Admitting: Gynecology

## 2015-04-10 VITALS — BP 120/76 | Ht 59.5 in | Wt 136.6 lb

## 2015-04-10 DIAGNOSIS — Z23 Encounter for immunization: Secondary | ICD-10-CM | POA: Diagnosis not present

## 2015-04-10 DIAGNOSIS — Z8632 Personal history of gestational diabetes: Secondary | ICD-10-CM | POA: Diagnosis not present

## 2015-04-10 DIAGNOSIS — Z01419 Encounter for gynecological examination (general) (routine) without abnormal findings: Secondary | ICD-10-CM

## 2015-04-10 LAB — COMPREHENSIVE METABOLIC PANEL
ALT: 23 U/L (ref 6–29)
AST: 25 U/L (ref 10–30)
Albumin: 4.3 g/dL (ref 3.6–5.1)
Alkaline Phosphatase: 57 U/L (ref 33–115)
BUN: 16 mg/dL (ref 7–25)
CALCIUM: 8.7 mg/dL (ref 8.6–10.2)
CHLORIDE: 106 mmol/L (ref 98–110)
CO2: 26 mmol/L (ref 20–31)
Creat: 0.65 mg/dL (ref 0.50–1.10)
Glucose, Bld: 82 mg/dL (ref 65–99)
POTASSIUM: 3.8 mmol/L (ref 3.5–5.3)
Sodium: 140 mmol/L (ref 135–146)
TOTAL PROTEIN: 7 g/dL (ref 6.1–8.1)
Total Bilirubin: 0.6 mg/dL (ref 0.2–1.2)

## 2015-04-10 LAB — CBC WITH DIFFERENTIAL/PLATELET
Basophils Absolute: 0 10*3/uL (ref 0.0–0.1)
Basophils Relative: 0 % (ref 0–1)
EOS PCT: 2 % (ref 0–5)
Eosinophils Absolute: 0.1 10*3/uL (ref 0.0–0.7)
HEMATOCRIT: 40.4 % (ref 36.0–46.0)
Hemoglobin: 13.1 g/dL (ref 12.0–15.0)
LYMPHS ABS: 1.6 10*3/uL (ref 0.7–4.0)
LYMPHS PCT: 32 % (ref 12–46)
MCH: 29 pg (ref 26.0–34.0)
MCHC: 32.4 g/dL (ref 30.0–36.0)
MCV: 89.6 fL (ref 78.0–100.0)
MONOS PCT: 8 % (ref 3–12)
MPV: 10.2 fL (ref 8.6–12.4)
Monocytes Absolute: 0.4 10*3/uL (ref 0.1–1.0)
Neutro Abs: 2.9 10*3/uL (ref 1.7–7.7)
Neutrophils Relative %: 58 % (ref 43–77)
PLATELETS: 223 10*3/uL (ref 150–400)
RBC: 4.51 MIL/uL (ref 3.87–5.11)
RDW: 13 % (ref 11.5–15.5)
WBC: 5 10*3/uL (ref 4.0–10.5)

## 2015-04-10 LAB — LIPID PANEL
CHOLESTEROL: 114 mg/dL — AB (ref 125–200)
HDL: 38 mg/dL — ABNORMAL LOW (ref >=46)
LDL Cholesterol: 64 mg/dL (ref <130)
TRIGLYCERIDES: 61 mg/dL (ref <150)
Total CHOL/HDL Ratio: 3 Ratio (ref <=5.0)
VLDL: 12 mg/dL (ref <30)

## 2015-04-10 MED ORDER — BETAMETHASONE DIPROPIONATE 0.05 % EX CREA: TOPICAL_CREAM | Freq: Two times a day (BID) | CUTANEOUS | Status: DC

## 2015-04-10 NOTE — Patient Instructions (Signed)
Vrtigo posicional benigno (Benign Positional Vertigo) El vrtigo es la sensacin de que usted o todo lo que lo rodea se mueven cuando en realidad eso no sucede. El vrtigo posicional benigno es el tipo de vrtigo ms comn. La causa de este trastorno no es grave (es benigna). Algunos movimientos y determinadas posiciones pueden desencadenar el trastorno (es posicional). El vrtigo posicional benigno puede ser peligroso si ocurre mientras est haciendo algo que podra suponer un riesgo para usted y para los dems, por ejemplo, conduciendo un automvil.  CAUSAS En muchos de los [place], se desconoce la causa de este trastorno. Puede deberse a Passenger transport manager zona del odo interno que ayuda al cerebro a percibir el movimiento y a Neurosurgeon equilibrio. Esta alteracin puede deberse a una infeccin viral (laberintitis), a una lesin en la cabeza o a los movimientos reiterados. FACTORES DE RIESGO Es ms probable que esta afeccin se manifieste en:  Las mujeres.  [place] [id number]. SNTOMAS Generalmente, los sntomas de este trastorno se presentan al mover la cabeza o los ojos en diferentes direcciones. Pueden aparecer repentinamente y suelen durar menos de un minuto. Entre los sntomas se pueden incluir los siguientes:  Prdida del equilibrio y cadas.  Sensacin de estar dando vueltas o movindose.  Sensacin de que el entorno est dando vueltas o movindose.  Nuseas y vmitos.  Visin borrosa.  Mareos.  Movimientos oculares involuntarios (nistagmo). Los sntomas pueden ser leves y algo fastidiosos, o pueden ser graves e interferir en la vida cotidiana. Los episodios de vrtigo posicional benigno pueden repetirse (ser recurrentes) a lo largo del [place], y algunos movimientos pueden desencadenarlos. Los sntomas pueden mejorar con Physiological scientist. DIAGNSTICO Generalmente, este trastorno se diagnostica con una historia clnica y un examen fsico de la cabeza, el cuello y los  odos. Tal vez lo deriven a [place] en problemas de la garganta, la nariz y el odo (otorrinolaringlogo), o a uno que se especializa en trastornos del sistema nervioso (neurlogo). Pueden hacerle otros estudios, entre ellos:  Health visitor.  Tomografa computarizada.  Estudios de los [employer]. El mdico puede pedirle que cambie rpidamente de posicin mientras observa si se presentan sntomas de vrtigo posicional benigno, por ejemplo, nistagmo. Los movimientos oculares se pueden estudiar con una electronistagmografa (ENG), con estimulacin trmica, mediante la maniobra de [place] o con la prueba de rotacin.  Electroencefalograma (EEG). Este estudio registra la actividad elctrica del cerebro.  Pruebas de audicin. Kennis Carina, para tratar este trastorno, el mdico le har movimientos especficos con la cabeza para que el odo interno se normalice. [employer] casos son graves, tal vez haya que realizar una ciruga, pero esto no es frecuente. En algunos casos, el vrtigo posicional benigno se resuelve por s solo en el trmino de 2 o 4semanas. INSTRUCCIONES PARA EL CUIDADO EN EL HOGAR Seguridad  Muvase lentamente.No haga movimientos bruscos con el cuerpo o con la cabeza.  No conduzca.  No opere maquinaria pesada.  No haga ninguna tarea que podra ser peligrosa para usted o para Producer, television/film/video en caso de que ocurriera un episodio de vrtigo.  Si tiene dificultad para caminar o mantener el equilibrio, use un bastn para Consulting civil engineer estabilidad. Si se siente mareado o inestable, sintese de inmediato.  Reanude sus actividades normales como se lo haya indicado el mdico. Pregntele al mdico qu actividades son seguras para usted. Instrucciones generales  [employer] de venta libre y los recetados solamente como se lo haya indicado el mdico.  Evite algunas posiciones o determinados movimientos como se lo haya indicado el  mdico.  Beba suficiente lquido para mantener la orina clara o de color amarillo plido.  Concurra a todas las visitas de control como se lo haya indicado el mdico. Esto es importante. SOLICITE ATENCIN MDICA SI:  Tiene fiebre.  El trastorno empeora, o le aparecen sntomas nuevos.  Sus familiares o amigos advierten cambios en su comportamiento.  Las nuseas o los vmitos empeoran.  Tiene sensacin de hormigueo o de adormecimiento. SOLICITE ATENCIN MDICA DE INMEDIATO SI:  Tiene dificultad para hablar o para moverse.  Esta mareado todo el tiempo.  Se desmaya.  Tiene dolores de cabeza intensos.  Tiene debilidad en los brazos o las piernas.  Tiene cambios en la audicin o la visin.  Siente rigidez en el cuello.  Tiene sensibilidad a la luz.   Esta informacin no tiene como fin reemplazar el consejo del mdico. Asegrese de hacerle al mdico cualquier pregunta que tenga.   Document Released: 07/01/2008 Document Revised: 12/04/2014 Elsevier Interactive Patient Education 2016 Elsevier Inc.  

## 2015-04-10 NOTE — Addendum Note (Signed)
Addended by: Berna SpareASTILLO, Pranavi Aure A on: 04/10/2015 09:42 AM   Modules accepted: Orders, SmartSet

## 2015-04-10 NOTE — Progress Notes (Signed)
Paige Wise 1975-12-16 [id number]   History:    40 y.o.  for annual gyn exam with no complaints today. Patient is here fasting to have her blood work. Patient with no previous history of any abnormal Pap smears. Patient had a ParaGard T380A placed in 2010 and is having normal menstrual cycles.  Past medical history,surgical history, family history and social history were all reviewed and documented in the EPIC chart.  Gynecologic History Patient's last menstrual period was 04/04/2015. Contraception: IUD Last Pap: 2016. Results were: normal Last mammogram: 2015. Results were: normal  Obstetric History OB History  Gravida Para Term Preterm AB SAB TAB Ectopic Multiple Living  5 3 3  2 2    3     # Outcome Date GA Lbr Len/2nd Weight Sex Delivery Anes PTL Lv  5 SAB           4 SAB           3 Term     F Vag-Spont   Y  2 Term     F Vag-Spont   Y  1 Term     M Vag-Spont   Y       ROS: A ROS was performed and pertinent positives and negatives are included in the history.  GENERAL: No fevers or chills. HEENT: No change in vision, no earache, sore throat or sinus congestion. NECK: No pain or stiffness. CARDIOVASCULAR: No chest pain or pressure. No palpitations. PULMONARY: No shortness of breath, cough or wheeze. GASTROINTESTINAL: No abdominal pain, nausea, vomiting or diarrhea, melena or bright red blood per rectum. GENITOURINARY: No urinary frequency, urgency, hesitancy or dysuria. MUSCULOSKELETAL: No joint or muscle pain, no back pain, no recent trauma. DERMATOLOGIC: No rash, no itching, no lesions. ENDOCRINE: No polyuria, polydipsia, no heat or cold intolerance. No recent change in weight. HEMATOLOGICAL: No anemia or easy bruising or bleeding. NEUROLOGIC: No headache, seizures, numbness, tingling or weakness. PSYCHIATRIC: No depression, no loss of interest in normal activity or change in sleep pattern.     Exam: chaperone present  BP 120/76 mmHg  Ht 4' 11.5" (1.511 m)  Wt 136 lb  9.6 oz (61.961 kg)  BMI 27.14 kg/m2  LMP 04/04/2015  Body mass index is 27.14 kg/(m^2).  General appearance : Well developed well nourished female. No acute distress HEENT: Eyes: no retinal hemorrhage or exudates,  Neck supple, trachea midline, no carotid bruits, no thyroidmegaly Lungs: Clear to auscultation, no rhonchi or wheezes, or rib retractions  Heart: Regular rate and rhythm, no murmurs or gallops Breast:Examined in sitting and supine position were symmetrical in appearance, no palpable masses or tenderness,  no skin retraction, no nipple inversion, no nipple discharge, no skin discoloration, no axillary or supraclavicular lymphadenopathy Abdomen: no palpable masses or tenderness, no rebound or guarding Extremities: no edema or skin discoloration or tenderness  Pelvic:  Bartholin, Urethra, Skene Glands: Within normal limits             Vagina: No gross lesions or discharge  Cervix: No gross lesions or discharge  Uterus  anteverted, normal size, shape and consistency, non-tender and mobile  Adnexa  Without masses or tenderness  Anus and perineum  normal   Rectovaginal  normal sphincter tone without palpated masses or tenderness             Hemoccult not indicated     Assessment/Plan:  40 y.o. female for annual exam doing well with ParaGard T380A IUD. IUD due to be removed. She  is fasting today so the following screening blood will be ordered: Comprehensive metabolic panel, along with CBC, fasting lipid profile, TSH, and urinalysis. Requisition to schedule her overdue mammogram was provided. She was reminded to do monthly breast exams. Patient received the flu vaccine today.   Ok EdwardsFERNANDEZ,JUAN H MD, 8:50 AM 04/10/2015

## 2015-04-11 LAB — URINALYSIS W MICROSCOPIC + REFLEX CULTURE
Bilirubin Urine: NEGATIVE
CASTS: NONE SEEN [LPF]
Crystals: NONE SEEN [HPF]
Glucose, UA: NEGATIVE
Ketones, ur: NEGATIVE
NITRITE: NEGATIVE
PH: 6.5 (ref 5.0–8.0)
PROTEIN: NEGATIVE
Specific Gravity, Urine: 1.026 (ref 1.001–1.035)
YEAST: NONE SEEN [HPF]

## 2015-04-11 LAB — TSH: TSH: 1.937 u[IU]/mL (ref 0.350–4.500)

## 2015-04-13 LAB — URINE CULTURE: Colony Count: 70000

## 2015-04-14 ENCOUNTER — Other Ambulatory Visit: Payer: Self-pay | Admitting: Gynecology

## 2015-04-14 MED ORDER — CIPROFLOXACIN HCL 250 MG PO TABS: 250.0000 mg | ORAL_TABLET | Freq: Two times a day (BID) | ORAL | Status: DC

## 2015-05-02 ENCOUNTER — Other Ambulatory Visit: Payer: Self-pay

## 2015-05-02 DIAGNOSIS — Z1231 Encounter for screening mammogram for malignant neoplasm of breast: Secondary | ICD-10-CM

## 2015-05-20 ENCOUNTER — Ambulatory Visit: Payer: Managed Care, Other (non HMO)

## 2015-05-29 ENCOUNTER — Ambulatory Visit
Admission: RE | Admit: 2015-05-29 | Discharge: 2015-05-29 | Disposition: A | Discharge status: Home / Self Care | Payer: BLUE CROSS/BLUE SHIELD | Source: Ambulatory Visit

## 2015-05-29 ENCOUNTER — Ambulatory Visit: Payer: Managed Care, Other (non HMO)

## 2015-05-29 DIAGNOSIS — Z1231 Encounter for screening mammogram for malignant neoplasm of breast: Secondary | ICD-10-CM

## 2016-04-15 ENCOUNTER — Encounter: Payer: BLUE CROSS/BLUE SHIELD | Admitting: Gynecology

## 2016-04-22 ENCOUNTER — Encounter: Payer: Self-pay | Admitting: Gynecology

## 2016-04-22 ENCOUNTER — Ambulatory Visit (INDEPENDENT_AMBULATORY_CARE_PROVIDER_SITE_OTHER): Payer: BLUE CROSS/BLUE SHIELD | Admitting: Gynecology

## 2016-04-22 VITALS — BP 122/80 | Ht 59.0 in | Wt 141.0 lb

## 2016-04-22 DIAGNOSIS — N898 Other specified noninflammatory disorders of vagina: Secondary | ICD-10-CM

## 2016-04-22 DIAGNOSIS — Z01419 Encounter for gynecological examination (general) (routine) without abnormal findings: Secondary | ICD-10-CM

## 2016-04-22 DIAGNOSIS — Z23 Encounter for immunization: Secondary | ICD-10-CM | POA: Diagnosis not present

## 2016-04-22 LAB — URINALYSIS W MICROSCOPIC + REFLEX CULTURE
Bilirubin Urine: NEGATIVE
CASTS: NONE SEEN [LPF]
CRYSTALS: NONE SEEN [HPF]
Glucose, UA: NEGATIVE
HGB URINE DIPSTICK: NEGATIVE
Ketones, ur: NEGATIVE
Nitrite: NEGATIVE
PROTEIN: NEGATIVE
RBC / HPF: NONE SEEN RBC/HPF (ref <=2)
Specific Gravity, Urine: 1.017 (ref 1.001–1.035)
YEAST: NONE SEEN [HPF]
pH: 6.5 (ref 5.0–8.0)

## 2016-04-22 LAB — CBC WITH DIFFERENTIAL/PLATELET
BASOS ABS: 56 {cells}/uL (ref 0–200)
Basophils Relative: 1 %
EOS ABS: 56 {cells}/uL (ref 15–500)
EOS PCT: 1 %
HCT: 39.9 % (ref 35.0–45.0)
Hemoglobin: 13.1 g/dL (ref 11.7–15.5)
LYMPHS PCT: 35 %
Lymphs Abs: 1960 cells/uL (ref 850–3900)
MCH: 29.6 pg (ref 27.0–33.0)
MCHC: 32.8 g/dL (ref 32.0–36.0)
MCV: 90.1 fL (ref 80.0–100.0)
MONOS PCT: 10 %
MPV: 10 fL (ref 7.5–12.5)
Monocytes Absolute: 560 cells/uL (ref 200–950)
NEUTROS PCT: 53 %
Neutro Abs: 2968 cells/uL (ref 1500–7800)
PLATELETS: 219 10*3/uL (ref 140–400)
RBC: 4.43 MIL/uL (ref 3.80–5.10)
RDW: 13.5 % (ref 11.0–15.0)
WBC: 5.6 10*3/uL (ref 3.8–10.8)

## 2016-04-22 LAB — LIPID PANEL
CHOL/HDL RATIO: 2.8 ratio (ref <5.0)
CHOLESTEROL: 108 mg/dL (ref <200)
HDL: 38 mg/dL — ABNORMAL LOW (ref >50)
LDL Cholesterol: 57 mg/dL (ref <100)
Triglycerides: 66 mg/dL (ref <150)
VLDL: 13 mg/dL (ref <30)

## 2016-04-22 LAB — COMPREHENSIVE METABOLIC PANEL
ALT: 24 U/L (ref 6–29)
AST: 23 U/L (ref 10–30)
Albumin: 4.1 g/dL (ref 3.6–5.1)
Alkaline Phosphatase: 64 U/L (ref 33–115)
BUN: 10 mg/dL (ref 7–25)
CHLORIDE: 105 mmol/L (ref 98–110)
CO2: 24 mmol/L (ref 20–31)
CREATININE: 0.71 mg/dL (ref 0.50–1.10)
Calcium: 8.7 mg/dL (ref 8.6–10.2)
Glucose, Bld: 88 mg/dL (ref 65–99)
POTASSIUM: 4.1 mmol/L (ref 3.5–5.3)
SODIUM: 137 mmol/L (ref 135–146)
Total Bilirubin: 0.4 mg/dL (ref 0.2–1.2)
Total Protein: 7 g/dL (ref 6.1–8.1)

## 2016-04-22 LAB — WET PREP FOR TRICH, YEAST, CLUE
Clue Cells Wet Prep HPF POC: NONE SEEN
TRICH WET PREP: NONE SEEN
Yeast Wet Prep HPF POC: NONE SEEN

## 2016-04-22 LAB — TSH: TSH: 1.76 mIU/L

## 2016-04-22 MED ORDER — BETAMETHASONE DIPROPIONATE 0.05 % EX CREA: TOPICAL_CREAM | Freq: Two times a day (BID) | CUTANEOUS | 3 refills | Status: DC

## 2016-04-22 MED ORDER — TINIDAZOLE 500 MG PO TABS: ORAL_TABLET | ORAL | 0 refills | Status: DC

## 2016-04-22 NOTE — Addendum Note (Signed)
Addended by: Kem ParkinsonBARNES, Oumar Marcott on: 04/22/2016 08:57 AM   Modules accepted: Orders

## 2016-04-22 NOTE — Patient Instructions (Addendum)
Influenza Virus Vaccine (Flucelvax) Qu es este medicamento? La VACUNA ANTIGRIPAL ayuda a disminuir el riesgo de contraer la influenza, tambin conocida como la gripe. La vacuna solo ayuda a protegerle contra algunas cepas de influenza. MARCAS COMUNES: FLUCELVAX Qu le debo informar a mi profesional de la salud antes de tomar este medicamento? Necesita saber si usted presenta alguno de los siguientes problemas o situaciones: -trastorno de sangrado como hemofilia -fiebre o infeccin -sndrome de Guillain-Barre u otros problemas neurolgicos -problemas del sistema inmunolgico -infeccin por el virus de la inmunodeficiencia humana (VIH) o SIDA -niveles bajos de plaquetas en la sangre -esclerosis mltiple -una reaccin alrgica o inusual a las vacunas antigripales, a otros medicamentos, alimentos, colorantes o conservantes -si est embarazada o buscando quedar embarazada -si est amamantando a un beb Cmo debo utilizar este medicamento? Esta vacuna se administra mediante inyeccin por va intramuscular. Lo administra un profesional de la salud. Recibir una copia de informacin escrita sobre la vacuna antes de cada vacuna. Asegrese de leer este folleto cada vez cuidadosamente. Este folleto puede cambiar con frecuencia. Hable con su pediatra para informarse acerca del uso de este medicamento en nios. Puede requerir atencin especial. Qu sucede si me olvido de una dosis? No se aplica en este caso. Qu puede interactuar con este medicamento? -quimioterapia o radioterapia -medicamentos que suprimen el sistema inmunolgico, tales como etanercept, anakinra, infliximab y adalimumab -medicamentos que tratan o previenen cogulos sanguneos, como warfarina -fenitona -medicamentos esteroideos, como la prednisona o la cortisona -teofilina -vacunas A qu debo estar atento al usar este medicamento? Informe a su mdico o a su profesional de la salud sobre todos los efectos secundarios que  persistan despus de 3 das. Llame a su proveedor de atencin mdica si se presentan sntomas inusuales dentro de las 6 semanas de recibir esta vacuna. Es posible que todava pueda contraer la gripe, pero la enfermedad no ser tan fuerte como normalmente. No puede contraer la gripe de esta vacuna. La vacuna antigripal no le protege contra resfros u otras enfermedades que pueden causar fiebre. Debe vacunarse cada ao. Qu efectos secundarios puedo tener al utilizar este medicamento? Efectos secundarios que debe informar a su mdico o a su profesional de la salud tan pronto como sea posible: -reacciones alrgicas como erupcin cutnea, picazn o urticarias, hinchazn de la cara, labios o lengua Efectos secundarios que, por lo general, no requieren atencin mdica (debe informarlos a su mdico o a su profesional de la salud si persisten o si son molestos): -fiebre -dolor de cabeza -molestias y dolores musculares -dolor, sensibilidad, enrojecimiento o hinchazn en el lugar de la inyeccin -cansancio Dnde debo guardar mi medicina? Esta vacuna se administrar por un profesional de la salud en una clnica, farmacia, consultorio mdico u otro consultorio de un profesional de la salud. No se le suministrar esta vacuna para guardar en su domicilio.  2017 Elsevier/Gold Standard (2011-03-01 16:29:16)  Vaginosis bacteriana (Bacterial Vaginosis) La vaginosis bacteriana es una infeccin vaginal que perturba el equilibrio normal de las bacterias que se encuentran en la vagina. Es el resultado de un crecimiento excesivo de ciertas bacterias. Esta es la infeccin vaginal ms frecuente en mujeres en edad reproductiva. El tratamiento es importante para prevenir complicaciones, especialmente en mujeres embarazadas, dado que puede causar un parto prematuro. CAUSAS La vaginosis bacteriana se origina por un aumento de bacterias nocivas que, generalmente, estn presentes en cantidades ms pequeas en la vagina.  Varios tipos diferentes de bacterias pueden causar esta afeccin. Sin embargo, la causa de su desarrollo no se   comprende totalmente. FACTORES DE RIESGO Ciertas actividades o comportamientos pueden exponerlo a un mayor riesgo de desarrollar vaginosis bacteriana, entre los que se incluyen:  Tener una nueva pareja sexual o mltiples parejas sexuales.  Las duchas vaginales  El uso del DIU (dispositivo intrauterino) como mtodo anticonceptivo. El contagio no se produce en baos, por ropas de cama, en piscinas o por contacto con objetos. SIGNOS Y SNTOMAS Algunas mujeres que padecen vaginosis bacteriana no presentan signos ni sntomas. Los sntomas ms comunes son:  Secrecin vaginal de color grisceo.  Secrecin vaginal con olor similar al pescado, especialmente despus de mantener relaciones sexuales.  Picazn o sensacin de ardor en la vagina o la vulva.  Ardor o dolor al orinar. DIAGNSTICO Su mdico analizar su historia clnica y le examinar la vagina para detectar signos de vaginosis bacteriana. Puede tomarle una muestra de flujo vaginal. Su mdico examinar esta muestra con un microscopio para controlar las bacterias y clulas anormales. Tambin puede realizarse un anlisis del pH vaginal. TRATAMIENTO La vaginosis bacteriana puede tratarse con antibiticos, en forma de comprimidos o de crema vaginal. Puede indicarse una segunda tanda de antibiticos si la afeccin se repite despus del tratamiento. Debido a que la vaginosis bacteriana aumenta el riesgo de contraer enfermedades de transmisin sexual, el tratamiento puede ayudar a reducir el riesgo de clamidia, gonorrea, VIH y herpes. INSTRUCCIONES PARA EL CUIDADO EN EL HOGAR  Tome solo medicamentos de venta libre o recetados, segn las indicaciones del mdico.  Si le han recetado antibiticos, tmelos como se le indic. Asegrese de que finaliza la prescripcin completa aunque se sienta mejor.  Comunique a sus compaeros sexuales que  sufre una infeccin vaginal. Deben consultar a su mdico y recibir tratamiento si tienen problemas, como picazn o una erupcin cutnea leve.  Durante el tratamiento, es importante que siga estas indicaciones:  Evite mantener relaciones sexuales o use preservativos de la forma correcta.  No se haga duchas vaginales.  Evite consumir alcohol como se lo haya indicado el mdico.  Evite amamantar como se lo haya indicado el mdico. SOLICITE ATENCIN MDICA SI:  Sus sntomas no mejoran despus de 3 das de tratamiento.  Aumenta la secrecin o el dolor.  Tiene fiebre. ASEGRESE DE QUE:  Comprende estas instrucciones.  Controlar su afeccin.  Recibir ayuda de inmediato si no mejora o si empeora. PARA OBTENER MS INFORMACIN Centros para el control y la prevencin de enfermedades (Centers for Disease Control and Prevention, CDC): www.cdc.gov/std Asociacin Est[place]oun[place]ense de la Salud Sexual (American Sexual Health Association, SHA): www.ashastd.org Esta informacin no tiene como fin reemplazar el consejo del mdico. Asegrese de hacerle al mdico cualquier pregunta que tenga. Document Released: 06/22/2007 Document Revised: 04/05/2014 Document Reviewed: 10/25/2012 Elsevier Interactive Patient Education  2017 Elsevier Inc.  

## 2016-04-22 NOTE — Progress Notes (Signed)
Paige Wise 12/14/1975 [id number]   History:    41 y.o.  for annual gyn exam who is asymptomatic but had some concern about some slight vaginal odor. She denies any vaginal discharge.Patient is here fasting to have her blood work. Patient with no previous history of any abnormal Pap smears. Patient had a ParaGard T380A placed in 2010 and is having normal menstrual cycles.  Past medical history,surgical history, family history and social history were all reviewed and documented in the EPIC chart.  Gynecologic History Patient's last menstrual period was 04/02/2016 (approximate). Contraception: IUD Last Pap: 2016. Results were: normal Last mammogram: 2017. Results were: normal  Obstetric History OB History  Gravida Para Term Preterm AB Living  5 3 3   2 3   SAB TAB Ectopic Multiple Live Births  2       3    # Outcome Date GA Lbr Len/2nd Weight Sex Delivery Anes PTL Lv  5 SAB           4 SAB           3 Term     F Vag-Spont   LIV  2 Term     F Vag-Spont   LIV  1 Term     M Vag-Spont   LIV       ROS: A ROS was performed and pertinent positives and negatives are included in the history.  GENERAL: No fevers or chills. HEENT: No change in vision, no earache, sore throat or sinus congestion. NECK: No pain or stiffness. CARDIOVASCULAR: No chest pain or pressure. No palpitations. PULMONARY: No shortness of breath, cough or wheeze. GASTROINTESTINAL: No abdominal pain, nausea, vomiting or diarrhea, melena or bright red blood per rectum. GENITOURINARY: No urinary frequency, urgency, hesitancy or dysuria. MUSCULOSKELETAL: No joint or muscle pain, no back pain, no recent trauma. DERMATOLOGIC: No rash, no itching, no lesions. ENDOCRINE: No polyuria, polydipsia, no heat or cold intolerance. No recent change in weight. HEMATOLOGICAL: No anemia or easy bruising or bleeding. NEUROLOGIC: No headache, seizures, numbness, tingling or weakness. PSYCHIATRIC: No depression, no loss of interest in normal  activity or change in sleep pattern.     Exam: chaperone present  BP 122/80   Ht 4\' 11"  (1.499 m)   Wt 141 lb (64 kg)   LMP 04/02/2016 (Approximate)   BMI 28.48 kg/m   Body mass index is 28.48 kg/m.  General appearance : Well developed well nourished female. No acute distress HEENT: Eyes: no retinal hemorrhage or exudates,  Neck supple, trachea midline, no carotid bruits, no thyroidmegaly Lungs: Clear to auscultation, no rhonchi or wheezes, or rib retractions  Heart: Regular rate and rhythm, no murmurs or gallops Breast:Examined in sitting and supine position were symmetrical in appearance, no palpable masses or tenderness,  no skin retraction, no nipple inversion, no nipple discharge, no skin discoloration, no axillary or supraclavicular lymphadenopathy Abdomen: no palpable masses or tenderness, no rebound or guarding Extremities: no edema or skin discoloration or tenderness  Pelvic:  Bartholin, Urethra, Skene Glands: Within normal limits             Vagina: No gross lesions or discharge  Cervix: No gross lesions or discharge, IUD string visualized  Uterus  anteverted, normal size, shape and consistency, non-tender and mobile  Adnexa  Without masses or tenderness  Anus and perineum  normal   Rectovaginal  normal sphincter tone without palpated masses or tenderness  Hemoccult not indicated    Because of vaginal odor a wet prep was done which demonstrated the following: Few white blood cells and few bacteria   Assessment/Plan:  41 y.o. female for annual exam because of the vaginal odor and since the wet prep was done after bimanual exam with gel application I'm going to go ahead and treat her with Tindamax for suspected BV. She'll be prescribed Tindamax 500 mg tablets which he will take for today and repeat in 24 hours. The following screening blood work was ordered today: Comprehensive metabolic panel, fasting lipid profile, TSH, CBC, and urinalysis. Patient was  reminded to schedule her mammogram in March of this year. Mammogram not done today. Patient received the flu vaccine today.   Ok Edwards MD, 8:50 AM 04/22/2016

## 2016-04-23 LAB — URINE CULTURE: Organism ID, Bacteria: NO GROWTH

## 2016-08-11 ENCOUNTER — Encounter: Payer: Self-pay | Admitting: Gynecology

## 2016-08-13 ENCOUNTER — Other Ambulatory Visit: Payer: Self-pay | Admitting: Gynecology

## 2016-08-13 DIAGNOSIS — Z1231 Encounter for screening mammogram for malignant neoplasm of breast: Secondary | ICD-10-CM

## 2016-09-07 ENCOUNTER — Ambulatory Visit
Admission: RE | Admit: 2016-09-07 | Discharge: 2016-09-07 | Disposition: A | Discharge status: Home / Self Care | Payer: BLUE CROSS/BLUE SHIELD | Source: Ambulatory Visit | Attending: Gynecology | Admitting: Gynecology

## 2016-09-07 DIAGNOSIS — Z1231 Encounter for screening mammogram for malignant neoplasm of breast: Secondary | ICD-10-CM

## 2016-10-11 ENCOUNTER — Telehealth: Payer: Self-pay | Admitting: *Deleted

## 2016-10-11 DIAGNOSIS — N926 Irregular menstruation, unspecified: Secondary | ICD-10-CM

## 2016-10-11 NOTE — Telephone Encounter (Signed)
Pt has paraguard IUD placed in 2010, has always had cycle with paraguard, LMP:08/28/16, pt said no cycle yet for July, took UPT and it was negative. Pt is very nervous would like to come have HCG serum test to confirm negative UPT.  Please advise

## 2016-10-11 NOTE — Telephone Encounter (Signed)
Yes she can have qualitative beta hCG and we'll check a TSH and prolactin and she can make an appointment to see me 2-3 days after she has her blood drawn

## 2016-10-11 NOTE — Telephone Encounter (Signed)
Pt informed, coming on 10/13/16 @ 9:00am for labs will schedule OV at check out.

## 2016-10-13 ENCOUNTER — Other Ambulatory Visit: Payer: Self-pay | Admitting: Gynecology

## 2016-10-13 ENCOUNTER — Other Ambulatory Visit: Payer: BLUE CROSS/BLUE SHIELD

## 2016-10-13 DIAGNOSIS — N926 Irregular menstruation, unspecified: Secondary | ICD-10-CM

## 2016-10-13 LAB — TSH: TSH: 2.52 m[IU]/L

## 2016-10-14 LAB — HCG, SERUM, QUALITATIVE: Preg, Serum: NEGATIVE

## 2016-10-14 LAB — PROLACTIN: Prolactin: 7.5 ng/mL

## 2016-10-18 ENCOUNTER — Encounter: Payer: Self-pay | Admitting: Gynecology

## 2016-10-18 ENCOUNTER — Ambulatory Visit (INDEPENDENT_AMBULATORY_CARE_PROVIDER_SITE_OTHER): Payer: BLUE CROSS/BLUE SHIELD | Admitting: Gynecology

## 2016-10-18 VITALS — BP 118/80

## 2016-10-18 DIAGNOSIS — N911 Secondary amenorrhea: Secondary | ICD-10-CM | POA: Diagnosis not present

## 2016-10-18 LAB — FOLLICLE STIMULATING HORMONE: FSH: 17.3 m[IU]/mL

## 2016-10-18 MED ORDER — MEDROXYPROGESTERONE ACETATE 10 MG PO TABS: ORAL_TABLET | ORAL | 3 refills | Status: DC

## 2016-10-18 NOTE — Progress Notes (Signed)
   Patient is a 41 year old has a ParaGard IUD had called the office on July 16 stating that she had not had a menstrual cycle since June 2. She denied any nausea or vomiting nipple discharge or any unusual she'll disturbance. So she is here for follow-up. She did have a serum qualitative beta-hCG which was negative along with a TSH and prolactin. She stated she thought that she was beginning to have some spotting today.  Exam: Abdomen: Soft nontender no rebound or guarding Pelvic: Bartholin urethra Skene was within normal limits Vagina: Some menstrual blood was present IUD string was visualized Uterus: Anteverted normal size shape and consistency Adnexa: No palpable mass or tenderness Rectal exam not done  Assessment/plan: Secondary amenorrhea probably attributed to weight change. I'm going to prescribe Provera 10 mg to take for 10 days and then refills that in the event she does not have a spontaneous menses every 35 days she would take the Provera for 10 days providing home pregnancy test is negative. She does have a ParaGard T380A IUD. Patient otherwise scheduled to return back to the office in January 2019 for annual exam or when necessary.

## 2016-12-21 DIAGNOSIS — H40013 Open angle with borderline findings, low risk, bilateral: Secondary | ICD-10-CM | POA: Diagnosis not present

## 2017-01-04 ENCOUNTER — Ambulatory Visit: Payer: BLUE CROSS/BLUE SHIELD | Admitting: Women's Health

## 2017-04-25 ENCOUNTER — Encounter: Payer: BLUE CROSS/BLUE SHIELD | Admitting: Obstetrics & Gynecology

## 2017-04-28 ENCOUNTER — Encounter: Payer: BLUE CROSS/BLUE SHIELD | Admitting: Obstetrics & Gynecology

## 2017-05-24 ENCOUNTER — Ambulatory Visit (INDEPENDENT_AMBULATORY_CARE_PROVIDER_SITE_OTHER): Payer: BLUE CROSS/BLUE SHIELD | Admitting: Women's Health

## 2017-05-24 ENCOUNTER — Encounter: Payer: Self-pay | Admitting: Women's Health

## 2017-05-24 VITALS — BP 122/80 | Ht 59.0 in | Wt 140.0 lb

## 2017-05-24 DIAGNOSIS — R87610 Atypical squamous cells of undetermined significance on cytologic smear of cervix (ASC-US): Secondary | ICD-10-CM | POA: Diagnosis not present

## 2017-05-24 DIAGNOSIS — Z01419 Encounter for gynecological examination (general) (routine) without abnormal findings: Secondary | ICD-10-CM

## 2017-05-24 DIAGNOSIS — Z13220 Encounter for screening for lipoid disorders: Secondary | ICD-10-CM

## 2017-05-24 DIAGNOSIS — N898 Other specified noninflammatory disorders of vagina: Secondary | ICD-10-CM | POA: Diagnosis not present

## 2017-05-24 LAB — COMPREHENSIVE METABOLIC PANEL
AG RATIO: 1.7 (calc) (ref 1.0–2.5)
ALBUMIN MSPROF: 4.5 g/dL (ref 3.6–5.1)
ALKALINE PHOSPHATASE (APISO): 71 U/L (ref 33–115)
ALT: 18 U/L (ref 6–29)
AST: 21 U/L (ref 10–30)
BILIRUBIN TOTAL: 0.5 mg/dL (ref 0.2–1.2)
BUN: 14 mg/dL (ref 7–25)
CALCIUM: 9.4 mg/dL (ref 8.6–10.2)
CHLORIDE: 105 mmol/L (ref 98–110)
CO2: 28 mmol/L (ref 20–32)
Creat: 0.75 mg/dL (ref 0.50–1.10)
GLOBULIN: 2.7 g/dL (ref 1.9–3.7)
Glucose, Bld: 97 mg/dL (ref 65–99)
POTASSIUM: 4.6 mmol/L (ref 3.5–5.3)
Sodium: 139 mmol/L (ref 135–146)
TOTAL PROTEIN: 7.2 g/dL (ref 6.1–8.1)

## 2017-05-24 LAB — CBC WITH DIFFERENTIAL/PLATELET
BASOS PCT: 0.6 %
Basophils Absolute: 38 cells/uL (ref 0–200)
Eosinophils Absolute: 82 cells/uL (ref 15–500)
Eosinophils Relative: 1.3 %
HEMATOCRIT: 38.1 % (ref 35.0–45.0)
HEMOGLOBIN: 13.1 g/dL (ref 11.7–15.5)
LYMPHS ABS: 1871 {cells}/uL (ref 850–3900)
MCH: 30.1 pg (ref 27.0–33.0)
MCHC: 34.4 g/dL (ref 32.0–36.0)
MCV: 87.6 fL (ref 80.0–100.0)
MPV: 10.7 fL (ref 7.5–12.5)
Monocytes Relative: 7.9 %
NEUTROS ABS: 3812 {cells}/uL (ref 1500–7800)
NEUTROS PCT: 60.5 %
Platelets: 260 10*3/uL (ref 140–400)
RBC: 4.35 10*6/uL (ref 3.80–5.10)
RDW: 11.9 % (ref 11.0–15.0)
Total Lymphocyte: 29.7 %
WBC: 6.3 10*3/uL (ref 3.8–10.8)
WBCMIX: 498 {cells}/uL (ref 200–950)

## 2017-05-24 LAB — WET PREP FOR TRICH, YEAST, CLUE

## 2017-05-24 LAB — LIPID PANEL
CHOLESTEROL: 126 mg/dL (ref <200)
HDL: 39 mg/dL — ABNORMAL LOW (ref >50)
LDL Cholesterol (Calc): 73 mg/dL (calc)
Non-HDL Cholesterol (Calc): 87 mg/dL (calc) (ref <130)
Total CHOL/HDL Ratio: 3.2 (calc) (ref <5.0)
Triglycerides: 63 mg/dL (ref <150)

## 2017-05-24 NOTE — Progress Notes (Addendum)
Paige Wise 08/23/1975 [id number]    History:    Presents for annual exam.  Normal Pap and mammogram history. 2010 ParaGard IUD cycles every 1-3 months. History of GDM.  Past medical history, past surgical history, family history and social history were all reviewed and documented in the EPIC chart. 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 Mother hypertension.  ROS:  A ROS was performed and pertinent positives and negatives are included.  Exam:  Vitals:   05/24/17 0843  BP: 122/80  Weight: 140 lb (63.5 kg)  Height: 4\' 11"  (1.499 m)   Body mass index is 28.28 kg/m.   General appearance:  Normal Thyroid:  Symmetrical, normal in size, without palpable masses or nodularity. Respiratory  Auscultation:  Clear without wheezing or rhonchi Cardiovascular  Auscultation:  Regular rate, without rubs, murmurs or gallops  Edema/varicosities:  Not grossly evident Abdominal  Soft,nontender, without masses, guarding or rebound.  Liver/spleen:  No organomegaly noted  Hernia:  None appreciated  Skin  Inspection:  Grossly normal   Breasts: Examined lying and sitting.     Right: Without masses, retractions, discharge or axillary adenopathy.     Left: Without masses, retractions, discharge or axillary adenopathy. Gentitourinary   Inguinal/mons:  Normal without inguinal adenopathy  External genitalia:  Normal  BUS/Urethra/Skene's glands:  Normal  Vagina:  Normal wet prep negative, no odor noted  Cervix:  Normal IUD strings visible  Uterus:  normal in size, shape and contour.  Midline and mobile  Adnexa/parametria:     Rt: Without masses or tenderness.   Lt: Without masses or tenderness.  Anus and perineum: Normal  Digital rectal exam: Normal sphincter tone without palpated masses or tenderness  Assessment/Plan:  42 y.o. M HF G5 P3 for annual exam with  complaint of vaginal odor occasionally.   01/2009 ParaGard IUD cycles every 1-3  months  Plan: Reviewed normality of wet prep and exam, no odor noted, will watch at this time. Instructed to call if cycles space greater than 3 months, aware ParaGard IUD will need to be removed next year, reviewed Mirena IUD. SBE's, annual screening mammogram, calcium rich diet, vitamin D 1000 daily encouraged. Encouraged decrease calories/carbs and increased exercise. CBC, lipid panel, CMP, Pap with HR HPV typing, new screening guidelines reviewed.  Harrington Challengerancy J  [facility] TenayaWHNP, 9:47 AM 05/24/2017

## 2017-05-24 NOTE — Patient Instructions (Signed)

## 2017-05-25 ENCOUNTER — Encounter: Payer: Self-pay | Admitting: Women's Health

## 2017-05-27 LAB — PAP, TP IMAGING W/ HPV RNA, RFLX HPV TYPE 16,18/45: HPV DNA High Risk: NOT DETECTED

## 2017-09-19 ENCOUNTER — Other Ambulatory Visit: Payer: Self-pay | Admitting: Obstetrics & Gynecology

## 2017-09-19 ENCOUNTER — Other Ambulatory Visit: Payer: Self-pay | Admitting: Gynecology

## 2017-09-19 DIAGNOSIS — Z1231 Encounter for screening mammogram for malignant neoplasm of breast: Secondary | ICD-10-CM

## 2017-10-11 ENCOUNTER — Ambulatory Visit: Payer: BLUE CROSS/BLUE SHIELD

## 2017-11-01 ENCOUNTER — Ambulatory Visit
Admission: RE | Admit: 2017-11-01 | Discharge: 2017-11-01 | Disposition: A | Discharge status: Home / Self Care | Payer: BLUE CROSS/BLUE SHIELD | Source: Ambulatory Visit | Attending: Obstetrics & Gynecology | Admitting: Obstetrics & Gynecology

## 2017-11-01 DIAGNOSIS — Z1231 Encounter for screening mammogram for malignant neoplasm of breast: Secondary | ICD-10-CM

## 2017-12-27 DIAGNOSIS — H40013 Open angle with borderline findings, low risk, bilateral: Secondary | ICD-10-CM | POA: Diagnosis not present

## 2017-12-29 ENCOUNTER — Telehealth: Payer: Self-pay | Admitting: *Deleted

## 2017-12-29 NOTE — Telephone Encounter (Signed)
Patient called and left message for a return call, I called and received voicemail. I asked patient to call me.

## 2017-12-30 NOTE — Telephone Encounter (Signed)
Patient called stating LMP:10/20/17, negative UPT, states last time this happened Dr.Fernandez prescribed provera 10 mg to help start a cycle, or wait 1 more month? Please advise

## 2017-12-31 NOTE — Telephone Encounter (Signed)
Paige Wise has a paraguard IUD in, if no cycle by end of OCT, then provera 10mg  for 5 days unless she is now feeling bloated, crampy, then ok for provera now.  In my note at annual it says cycles are every 1-3 months.

## 2018-01-02 NOTE — Telephone Encounter (Signed)
Patient informed with the below.  

## 2018-02-15 ENCOUNTER — Telehealth: Payer: Self-pay | Admitting: *Deleted

## 2018-02-15 MED ORDER — MEDROXYPROGESTERONE ACETATE 10 MG PO TABS: 10.0000 mg | ORAL_TABLET | Freq: Every day | ORAL | 0 refills | Status: DC

## 2018-02-15 NOTE — Telephone Encounter (Signed)
ok 

## 2018-02-15 NOTE — Telephone Encounter (Signed)
Patient called and left message in voicemail c/o issues with cycles. I left message for patient to call me.

## 2018-02-15 NOTE — Telephone Encounter (Signed)
Paige Wise patient just FYI follow up from encounter on 12/29/17 patient said cycle never started,( I did not send Rx that day)  LMP was in August. Per encounter I will send in Rx for provera 10 mg daily  x 5 days.

## 2018-05-29 ENCOUNTER — Ambulatory Visit (INDEPENDENT_AMBULATORY_CARE_PROVIDER_SITE_OTHER): Payer: BC Managed Care – PPO | Admitting: Women's Health

## 2018-05-29 ENCOUNTER — Encounter: Payer: Self-pay | Admitting: Women's Health

## 2018-05-29 VITALS — BP 124/80 | Ht 59.0 in | Wt 138.0 lb

## 2018-05-29 DIAGNOSIS — Z01419 Encounter for gynecological examination (general) (routine) without abnormal findings: Secondary | ICD-10-CM | POA: Diagnosis not present

## 2018-05-29 DIAGNOSIS — Z13220 Encounter for screening for lipoid disorders: Secondary | ICD-10-CM

## 2018-05-29 LAB — COMPREHENSIVE METABOLIC PANEL
AG Ratio: 1.6 (calc) (ref 1.0–2.5)
ALBUMIN MSPROF: 4.3 g/dL (ref 3.6–5.1)
ALKALINE PHOSPHATASE (APISO): 71 U/L (ref 31–125)
ALT: 17 U/L (ref 6–29)
AST: 20 U/L (ref 10–30)
BUN: 19 mg/dL (ref 7–25)
CALCIUM: 9 mg/dL (ref 8.6–10.2)
CO2: 29 mmol/L (ref 20–32)
CREATININE: 0.77 mg/dL (ref 0.50–1.10)
Chloride: 104 mmol/L (ref 98–110)
Globulin: 2.7 g/dL (calc) (ref 1.9–3.7)
Glucose, Bld: 88 mg/dL (ref 65–99)
POTASSIUM: 3.9 mmol/L (ref 3.5–5.3)
Sodium: 138 mmol/L (ref 135–146)
TOTAL PROTEIN: 7 g/dL (ref 6.1–8.1)
Total Bilirubin: 0.4 mg/dL (ref 0.2–1.2)

## 2018-05-29 LAB — CBC WITH DIFFERENTIAL/PLATELET
Absolute Monocytes: 450 cells/uL (ref 200–950)
BASOS ABS: 42 {cells}/uL (ref 0–200)
Basophils Relative: 0.7 %
EOS PCT: 1.5 %
Eosinophils Absolute: 90 cells/uL (ref 15–500)
HCT: 39.5 % (ref 35.0–45.0)
Hemoglobin: 13.3 g/dL (ref 11.7–15.5)
Lymphs Abs: 1800 cells/uL (ref 850–3900)
MCH: 30.3 pg (ref 27.0–33.0)
MCHC: 33.7 g/dL (ref 32.0–36.0)
MCV: 90 fL (ref 80.0–100.0)
MPV: 10.9 fL (ref 7.5–12.5)
Monocytes Relative: 7.5 %
NEUTROS PCT: 60.3 %
Neutro Abs: 3618 cells/uL (ref 1500–7800)
PLATELETS: 220 10*3/uL (ref 140–400)
RBC: 4.39 10*6/uL (ref 3.80–5.10)
RDW: 12.2 % (ref 11.0–15.0)
TOTAL LYMPHOCYTE: 30 %
WBC: 6 10*3/uL (ref 3.8–10.8)

## 2018-05-29 LAB — LIPID PANEL
CHOL/HDL RATIO: 3.1 (calc) (ref <5.0)
CHOLESTEROL: 132 mg/dL (ref <200)
HDL: 42 mg/dL — AB (ref >=50)
LDL CHOLESTEROL (CALC): 74 mg/dL
Non-HDL Cholesterol (Calc): 90 mg/dL (calc) (ref <130)
TRIGLYCERIDES: 77 mg/dL (ref <150)

## 2018-05-29 NOTE — Progress Notes (Signed)
Paige Wise 1975/12/20 [id number]    History:    Presents for annual exam.  ParaGard IUD placed patient thinks November 2010.  Cycles every 1 to 3 months for the past 2 years.  History of irregular cycles in the past.  Normal Pap and mammogram history.  History of GDM.  Past medical history, past surgical history, family history and social history were all reviewed and documented in the EPIC chart.  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  Mother hypertension.  ROS:  A ROS was performed and pertinent positives and negatives are included.  Exam:  Vitals:   05/29/18 0849  BP: 124/80  Weight: 138 lb (62.6 kg)  Height: 4\' 11"  (1.499 m)   Body mass index is 27.87 kg/m.   General appearance:  Normal Thyroid:  Symmetrical, normal in size, without palpable masses or nodularity. Respiratory  Auscultation:  Clear without wheezing or rhonchi Cardiovascular  Auscultation:  Regular rate, without rubs, murmurs or gallops  Edema/varicosities:  Not grossly evident Abdominal  Soft,nontender, without masses, guarding or rebound.  Liver/spleen:  No organomegaly noted  Hernia:  None appreciated  Skin  Inspection:  Grossly normal   Breasts: Examined lying and sitting.     Right: Without masses, retractions, discharge or axillary adenopathy.     Left: Without masses, retractions, discharge or axillary adenopathy. Gentitourinary   Inguinal/mons:  Normal without inguinal adenopathy  External genitalia:  Normal  BUS/Urethra/Skene's glands:  Normal  Vagina:  Normal  Cervix:  Normal IUD string visible  Uterus:   normal in size, shape and contour.  Midline and mobile  Adnexa/parametria:     Rt: Without masses or tenderness.   Lt: Without masses or tenderness.  Anus and perineum: Normal  Digital rectal exam: Declined  Assessment/Plan:  43 y.o. M HF G5 P3 for annual exam with no complaints.  2010 ParaGard IUD cycles every 1 to 3  months  Plan: Schedule ParaGard IUD removal and replacement with Dr. Seymour Bars in June.  Unsure of exact date of placement but thinks in November.  SBEs, continue annual screening mammogram, calcium rich foods, vitamin D 1000 daily, and  increase regular exercise daily encouraged..  CBC, CMP, lipid panel, Pap ASCUS with negative HR HPV 2019, new screening guidelines reviewed.    Harrington Challenger [facility], 9:18 AM 05/29/2018

## 2018-05-29 NOTE — Patient Instructions (Addendum)
Vit d 1000 I   Walk daily  Intrauterine Device Information An intrauterine device (IUD) is a medical device that is inserted in the uterus to prevent pregnancy. It is a small, T-shaped device that has one or two nylon strings hanging down from it. The strings hang out of the lower part of the uterus (cervix) to allow for future IUD removal. There are two types of IUDs available:  Hormone IUD. This type of IUD is made of plastic and contains the hormone progestin (synthetic progesterone). A hormone IUD may last 3-5 years.  Copper IUD. This type of IUD has copper wire wrapped around it. A copper IUD may last up to 10 years. How is an IUD inserted? An IUD is inserted through the vagina and placed into the uterus with a minor medical procedure. The exact procedure for IUD insertion may vary among health care providers and hospitals. How does an IUD work? Synthetic progesterone in a hormonal IUD prevents pregnancy by:  Thickening cervical mucus to prevent sperm from entering the uterus.  Thinning the uterine lining to prevent a fertilized egg from being implanted there. Copper in a copper IUD prevents pregnancy by making the uterus and fallopian tubes produce a fluid that kills sperm. What are the advantages of an IUD? Advantages of either type of IUD  It is highly effective in preventing pregnancy.  It is reversible. You can become pregnant shortly after the IUD is removed.  It is low-maintenance and can stay in place for a long time.  There are no estrogen-related side effects.  It can be used when breastfeeding.  It is not associated with weight gain.  It can be inserted right after childbirth, an abortion, or a miscarriage. Advantages of a hormone IUD  If it is inserted within 7 days of your period starting, it works right after it is inserted. If the hormone IUD is inserted at any other time in your cycle, you will need to use a backup method of birth control for 7 days after  insertion.  It can make menstrual periods lighter.  It can reduce menstrual cramping.  It can be used for 3-5 years. Advantages of a copper IUD  It works right after it is inserted.  It can be used as a form of emergency birth control if it is inserted within 5 days after having unprotected sex.  It does not interfere with your body's natural hormones.  It can be used for 10 years. What are the disadvantages of an IUD?  An IUD may cause irregular menstrual bleeding for a period of time after insertion.  You may have pain during insertion and have cramping and vaginal bleeding after insertion.  An IUD may cut the uterus (uterine perforation) when it is inserted. This is rare.  An IUD may cause pelvic inflammatory disease (PID), which is an infection in the uterus and fallopian tubes. This is rare, and it usually happens during the first 20 days after the IUD is inserted.  A copper IUD can make your menstrual flow heavier and more painful. How is an IUD removed?  You will lie on your back with your knees bent and your feet in footrests (stirrups).  A device will be inserted into your vagina to spread apart the vaginal walls (speculum). This will allow your health care provider to see the strings attached to the IUD.  Your health care provider will use a small instrument (forceps) to grasp the IUD strings and pull firmly until  the IUD is removed. You may have some discomfort when the IUD is removed. Your health care provider may recommend taking over-the-counter pain relievers, such as ibuprofen, before the procedure. You may also have minor spotting for a few days after the procedure. The exact procedure for IUD removal may vary among health care providers and hospitals. Is the IUD right for me? Your health care provider will make sure you are a good candidate for an IUD and will discuss the advantages, disadvantages, and possible side effects with you. Summary  An intrauterine  device (IUD) is a medical device that is inserted in the uterus to prevent pregnancy. It is a small, T-shaped device that has one or two nylon strings hanging down from it.  A hormone IUD contains the hormone progestin (synthetic progesterone). A copper IUD has copper wire wrapped around it.  Synthetic progesterone in a hormone IUD prevents pregnancy by thickening cervical mucus and thinning the walls of the uterus. Copper in a copper IUD prevents pregnancy by making the uterus and fallopian tubes produce a fluid that kills sperm.  A hormone IUD can be left in place for 3-5 years. A copper IUD can be left in place for up to 10 years.  An IUD is inserted and removed by a health care provider. You may feel some pain during insertion and removal. Your health care provider may recommend taking over-the-counter pain medicine, such as ibuprofen, before an IUD procedure. This information is not intended to replace advice given to you by your health care provider. Make sure you discuss any questions you have with your health care provider. Document Released: 02/17/2004 Document Revised: 04/13/2016 Document Reviewed: 04/13/2016 Elsevier Interactive Patient Education  2019 [place] Maintenance, Female Adopting a healthy lifestyle and getting preventive care can go a long way to promote health and wellness. Talk with your health care provider about what schedule of regular examinations is right for you. This is a good chance for you to check in with your provider about disease prevention and staying healthy. In between checkups, there are plenty of things you can do on your own. Experts have done a lot of research about which lifestyle changes and preventive measures are most likely to keep you healthy. Ask your health care provider for more information. Weight and diet Eat a healthy diet  Be sure to include plenty of vegetables, fruits, low-fat dairy products, and lean protein.  Do not eat  a lot of foods high in solid fats, added sugars, or salt.  Get regular exercise. This is one of the most important things you can do for your health. ? Most adults should exercise for at least 150 minutes each week. The exercise should increase your heart rate and make you sweat (moderate-intensity exercise). ? Most adults should also do strengthening exercises at least twice a week. This is in addition to the moderate-intensity exercise. Maintain a healthy weight  Body mass index (BMI) is a measurement that can be used to identify possible weight problems. It estimates body fat based on height and weight. Your health care provider can help determine your BMI and help you achieve or maintain a healthy weight.  For females 38 years of age and older: ? A BMI below 18.5 is considered underweight. ? A BMI of 18.5 to 24.9 is normal. ? A BMI of 25 to 29.9 is considered overweight. ? A BMI of 30 and above is considered obese. Watch levels of cholesterol and blood lipids  You should start having your blood tested for lipids and cholesterol at 45 years of age, then have this test every 5 years.  You may need to have your cholesterol levels checked more often if: ? Your lipid or cholesterol levels are high. ? You are older than 45 years of age. ? You are at high risk for heart disease. Cancer screening Lung Cancer  Lung cancer screening is recommended for adults 47-100 years old who are at high risk for lung cancer because of a history of smoking.  A yearly low-dose CT scan of the lungs is recommended for people who: ? Currently smoke. ? Have quit within the past 15 years. ? Have at least a 30-pack-year history of smoking. A pack year is smoking an average of one pack of cigarettes a day for 1 year.  Yearly screening should continue until it has been 15 years since you quit.  Yearly screening should stop if you develop a health problem that would prevent you from having lung cancer  treatment. Breast Cancer  Practice breast self-awareness. This means understanding how your breasts normally appear and feel.  It also means doing regular breast self-exams. Let your health care provider know about any changes, no matter how small.  If you are in your 20s or 30s, you should have a clinical breast exam (CBE) by a health care provider every 1-3 years as part of a regular health exam.  If you are 32 or older, have a CBE every year. Also consider having a breast X-ray (mammogram) every year.  If you have a family history of breast cancer, talk to your health care provider about genetic screening.  If you are at high risk for breast cancer, talk to your health care provider about having an MRI and a mammogram every year.  Breast cancer gene (BRCA) assessment is recommended for women who have family members with BRCA-related cancers. BRCA-related cancers include: ? Breast. ? Ovarian. ? Tubal. ? Peritoneal cancers.  Results of the assessment will determine the need for genetic counseling and BRCA1 and BRCA2 testing. Cervical Cancer Your health care provider may recommend that you be screened regularly for cancer of the pelvic organs (ovaries, uterus, and vagina). This screening involves a pelvic examination, including checking for microscopic changes to the surface of your cervix (Pap test). You may be encouraged to have this screening done every 3 years, beginning at age 21.  For women ages 93-65, health care providers may recommend pelvic exams and Pap testing every 3 years, or they may recommend the Pap and pelvic exam, combined with testing for human papilloma virus (HPV), every 5 years. Some types of HPV increase your risk of cervical cancer. Testing for HPV may also be done on women of any age with unclear Pap test results.  Other health care providers may not recommend any screening for nonpregnant women who are considered low risk for pelvic cancer and who do not have  symptoms. Ask your health care provider if a screening pelvic exam is right for you.  If you have had past treatment for cervical cancer or a condition that could lead to cancer, you need Pap tests and screening for cancer for at least 20 years after your treatment. If Pap tests have been discontinued, your risk factors (such as having a new sexual partner) need to be reassessed to determine if screening should resume. Some women have medical problems that increase the chance of getting cervical cancer. In these cases, your health  care provider may recommend more frequent screening and Pap tests. Colorectal Cancer  This type of cancer can be detected and often prevented.  Routine colorectal cancer screening usually begins at 45 years of age and continues through 45 years of age.  Your health care provider may recommend screening at an earlier age if you have risk factors for colon cancer.  Your health care provider may also recommend using home test kits to check for hidden blood in the stool.  A small camera at the end of a tube can be used to examine your colon directly (sigmoidoscopy or colonoscopy). This is done to check for the earliest forms of colorectal cancer.  Routine screening usually begins at age 41.  Direct examination of the colon should be repeated every 5-10 years through 45 years of age. However, you may need to be screened more often if early forms of precancerous polyps or small growths are found. Skin Cancer  Check your skin from head to toe regularly.  Tell your health care provider about any new moles or changes in moles, especially if there is a change in a mole's shape or color.  Also tell your health care provider if you have a mole that is larger than the size of a pencil eraser.  Always use sunscreen. Apply sunscreen liberally and repeatedly throughout the day.  Protect yourself by wearing long sleeves, pants, a wide-brimmed hat, and sunglasses whenever you are  outside. Heart disease, diabetes, and high blood pressure  High blood pressure causes heart disease and increases the risk of stroke. High blood pressure is more likely to develop in: ? People who have blood pressure in the high end of the normal range (130-139/85-89 mm Hg). ? People who are overweight or obese. ? People who are African American.  If you are 58-72 years of age, have your blood pressure checked every 3-5 years. If you are 20 years of age or older, have your blood pressure checked every year. You should have your blood pressure measured twice-once when you are at a hospital or clinic, and once when you are not at a hospital or clinic. Record the average of the two measurements. To check your blood pressure when you are not at a hospital or clinic, you can use: ? An automated blood pressure machine at a pharmacy. ? A home blood pressure monitor.  If you are between 57 years and 26 years old, ask your health care provider if you should take aspirin to prevent strokes.  Have regular diabetes screenings. This involves taking a blood sample to check your fasting blood sugar level. ? If you are at a normal weight and have a low risk for diabetes, have this test once every three years after 45 years of age. ? If you are overweight and have a high risk for diabetes, consider being tested at a younger age or more often. Preventing infection Hepatitis B  If you have a higher risk for hepatitis B, you should be screened for this virus. You are considered at high risk for hepatitis B if: ? You were born in a country where hepatitis B is common. Ask your health care provider which countries are considered high risk. ? Your parents were born in a high-risk country, and you have not been immunized against hepatitis B (hepatitis B vaccine). ? You have HIV or AIDS. ? You use needles to inject street drugs. ? You live with someone who has hepatitis B. ? You have had sex  with someone who has  hepatitis B. ? You get hemodialysis treatment. ? You take certain medicines for conditions, including cancer, organ transplantation, and autoimmune conditions. Hepatitis C  Blood testing is recommended for: ? Everyone born from [birth year] through 1967. ? Anyone with known risk factors for hepatitis C. Sexually transmitted infections (STIs)  You should be screened for sexually transmitted infections (STIs) including gonorrhea and chlamydia if: ? You are sexually active and are younger than 45 years of age. ? You are older than 45 years of age and your health care provider tells you that you are at risk for this type of infection. ? Your sexual activity has changed since you were last screened and you are at an increased risk for chlamydia or gonorrhea. Ask your health care provider if you are at risk.  If you do not have HIV, but are at risk, it may be recommended that you take a prescription medicine daily to prevent HIV infection. This is called pre-exposure prophylaxis (PrEP). You are considered at risk if: ? You are sexually active and do not regularly use condoms or know the HIV status of your partner(s). ? You take drugs by injection. ? You are sexually active with a partner who has HIV. Talk with your health care provider about whether you are at high risk of being infected with HIV. If you choose to begin PrEP, you should first be tested for HIV. You should then be tested every 3 months for as long as you are taking PrEP. Pregnancy  If you are premenopausal and you may become pregnant, ask your health care provider about preconception counseling.  If you may become pregnant, take 400 to 800 micrograms (mcg) of folic acid every day.  If you want to prevent pregnancy, talk to your health care provider about birth control (contraception). Osteoporosis and menopause  Osteoporosis is a disease in which the bones lose minerals and strength with aging. This can result in serious bone  fractures. Your risk for osteoporosis can be identified using a bone density scan.  If you are 30 years of age or older, or if you are at risk for osteoporosis and fractures, ask your health care provider if you should be screened.  Ask your health care provider whether you should take a calcium or vitamin D supplement to lower your risk for osteoporosis.  Menopause may have certain physical symptoms and risks.  Hormone replacement therapy may reduce some of these symptoms and risks. Talk to your health care provider about whether hormone replacement therapy is right for you. Follow these instructions at home:  Schedule regular health, dental, and eye exams.  Stay current with your immunizations.  Do not use any tobacco products including cigarettes, chewing tobacco, or electronic cigarettes.  If you are pregnant, do not drink alcohol.  If you are breastfeeding, limit how much and how often you drink alcohol.  Limit alcohol intake to no more than 1 drink per day for nonpregnant women. One drink equals 12 ounces of beer, 5 ounces of wine, or 1 ounces of hard liquor.  Do not use street drugs.  Do not share needles.  Ask your health care provider for help if you need support or information about quitting drugs.  Tell your health care provider if you often feel depressed.  Tell your health care provider if you have ever been abused or do not feel safe at home. This information is not intended to replace advice given to you by your health  care provider. Make sure you discuss any questions you have with your health care provider. Document Released: 09/28/2010 Document Revised: 08/21/2015 Document Reviewed: 12/17/2014 Elsevier Interactive Patient Education  2019 [employer].

## 2018-08-31 ENCOUNTER — Telehealth: Payer: Self-pay | Admitting: *Deleted

## 2018-08-31 NOTE — Telephone Encounter (Signed)
Patient called requesting refill for betamethasone cream 0.05% states she uses for hands. Please advise

## 2018-09-01 NOTE — Telephone Encounter (Signed)
Ok for refill? 

## 2018-09-04 ENCOUNTER — Other Ambulatory Visit: Payer: Self-pay

## 2018-09-04 MED ORDER — BETAMETHASONE DIPROPIONATE 0.05 % EX CREA: TOPICAL_CREAM | Freq: Two times a day (BID) | CUTANEOUS | 0 refills | Status: DC

## 2018-09-04 NOTE — Telephone Encounter (Signed)
Patient informed, Rx sent.  

## 2018-09-05 ENCOUNTER — Encounter: Payer: Self-pay | Admitting: Obstetrics & Gynecology

## 2018-09-05 ENCOUNTER — Ambulatory Visit (INDEPENDENT_AMBULATORY_CARE_PROVIDER_SITE_OTHER): Payer: BC Managed Care – PPO | Admitting: Obstetrics & Gynecology

## 2018-09-05 VITALS — BP 126/80

## 2018-09-05 DIAGNOSIS — T8339XA Other mechanical complication of intrauterine contraceptive device, initial encounter: Secondary | ICD-10-CM | POA: Diagnosis not present

## 2018-09-05 DIAGNOSIS — Z30432 Encounter for removal of intrauterine contraceptive device: Secondary | ICD-10-CM | POA: Diagnosis not present

## 2018-09-05 NOTE — Progress Notes (Signed)
    Paige Wise July 24, 1975 [id number]        43 y.o.  [id number] Married  RP: Removal and insertion of Paragard IUD  HPI: Paragard IUD x 01/2009.  Menses every 1-3 months, normal flow.  No pelvic pain   OB History  Gravida Para Term Preterm AB Living  5 3 3   2 3   SAB TAB Ectopic Multiple Live Births  2       3    # Outcome Date GA Lbr Len/2nd Weight Sex Delivery Anes PTL Lv  5 SAB           4 SAB           3 Term     F Vag-Spont   LIV  2 Term     F Vag-Spont   LIV  1 Term     M Vag-Spont   LIV    Past medical history,surgical history, problem list, medications, allergies, family history and social history were all reviewed and documented in the EPIC chart.   Directed ROS with pertinent positives and negatives documented in the history of present illness/assessment and plan.  Exam:  Vitals:   09/05/18 0831  BP: 126/80   General appearance:  Normal                                                                    IUD procedure note       Patient presented to the office today for removal and placement of Paragard IUD. The patient had previously been provided with literature information on this method of contraception. The risks benefits and pros and cons were discussed and all her questions were answered. She is fully aware that this form of contraception is 99% effective and is good for 10 years.  Pelvic exam: Vulva normal Vagina: No lesions or discharge Cervix: No lesions or discharge.  IUD strings visible. Uterus: AV position Adnexa: No masses or tenderness Rectal exam: Not done  The cervix was cleansed with Betadine solution. Hurricane spray on the cervix.  A single-tooth tenaculum was placed on the anterior cervical lip.  The strings were visible.  The strings were grasped with a fenestrated clamp and the IUD was removed easily without any resistance.  One arm of the T was missing.  The IUD was shown to the patient and discarded.  Using the IUD removal clamp  which reached the IU cavity easily, attempts were made to remove the missing arm of the IUD, but without success. The single-tooth tenaculum was removed. Patient was instructed to return back to the office to attempt removal of the missing arm under US guidance.        Assessment/Plan:  43 y.o. [id number]   1. Encounter for removal of intrauterine contraceptive device (IUD) Successful removal of the Paragard IUD, but the IUD was incomplete, missing one arm.  Situation thoroughly explained to patient, reassured.  Will f/u with a Pelvic US to attempt removal under US guidance with a paracervical block.  Patient reassured. - US Transvaginal Non-OB; Future  Counseling on above issues and coordination of care >50% x 15 minutes.  Princess Bruins MD, 8:39 AM 09/05/2018

## 2018-09-05 NOTE — Patient Instructions (Signed)
1. Encounter for removal of intrauterine contraceptive device (IUD) Successful removal of the Paragard IUD, but the IUD was incomplete, missing one arm.  Situation thoroughly explained to patient, reassured.  Will f/u with a Pelvic US to attempt removal under US guidance with a paracervical block.  Patient reassured. - US Transvaginal Non-OB; Future  Dominique, fue un placer verle hoy!

## 2018-09-18 ENCOUNTER — Other Ambulatory Visit: Payer: Self-pay | Admitting: Obstetrics & Gynecology

## 2018-09-18 ENCOUNTER — Other Ambulatory Visit: Payer: Self-pay

## 2018-09-18 ENCOUNTER — Encounter: Payer: Self-pay | Admitting: Obstetrics & Gynecology

## 2018-09-18 ENCOUNTER — Ambulatory Visit (INDEPENDENT_AMBULATORY_CARE_PROVIDER_SITE_OTHER): Payer: BC Managed Care – PPO

## 2018-09-18 ENCOUNTER — Ambulatory Visit (INDEPENDENT_AMBULATORY_CARE_PROVIDER_SITE_OTHER): Payer: BC Managed Care – PPO | Admitting: Obstetrics & Gynecology

## 2018-09-18 DIAGNOSIS — Z30011 Encounter for initial prescription of contraceptive pills: Secondary | ICD-10-CM

## 2018-09-18 DIAGNOSIS — Z30432 Encounter for removal of intrauterine contraceptive device: Secondary | ICD-10-CM

## 2018-09-18 DIAGNOSIS — T8332XD Displacement of intrauterine contraceptive device, subsequent encounter: Secondary | ICD-10-CM

## 2018-09-18 DIAGNOSIS — T8332XA Displacement of intrauterine contraceptive device, initial encounter: Secondary | ICD-10-CM

## 2018-09-18 MED ORDER — NORETHINDRONE 0.35 MG PO TABS: 1.0000 | ORAL_TABLET | Freq: Every day | ORAL | 4 refills | Status: DC

## 2018-09-18 NOTE — Progress Notes (Signed)
Paige CarnesRosa M Wise 1975/05/28 [id number]        43 y.o.  [id number] Married  RP: Incomplete Paragard IUD at removal on 09/05/2018  HPI: 1 arm of the Paragard IUD missing at removal on 09/05/2018.  No pelvic pain.  No abnomal vaginal bleeding.  No vaginal discharge.  No fever.  Patient prefers a pill form of contraception at this point.   OB History  Gravida Para Term Preterm AB Living  5 3 3   2 3   SAB TAB Ectopic Multiple Live Births  2       3    # Outcome Date GA Lbr Len/2nd Weight Sex Delivery Anes PTL Lv  5 SAB           4 SAB           3 Term     F Vag-Spont   LIV  2 Term     F Vag-Spont   LIV  1 Term     M Vag-Spont   LIV    Past medical history,surgical history, problem list, medications, allergies, family history and social history were all reviewed and documented in the EPIC chart.   Directed ROS with pertinent positives and negatives documented in the history of present illness/assessment and plan.  Exam:  There were no vitals filed for this visit. General appearance:  Normal                                                                    IUD procedure note       Patient presented to the office today to complete the removal of her Paragard IUD under US guidance as 1 arm was missing at removal on 09/05/2018.  Pelvic US:  T/V and T/A images.  Anteverted uterus, Left arm of IUD in mid uterine cavity.  Cervical Nabothian cyst 1.8 cm.  Right ovary thick-walled collapsed follicle 9 mm.  Coma shaped cyst echo-free suggests Hydrosalpinx 2.6 x 1.6 x 1.8 cm.  Left ovary normal.  No free fluid in the posterior cul-de-sac.  Pelvic exam: Vulva normal Vagina: No lesions or discharge Cervix: No lesions or discharge Uterus: AV position Adnexa: No masses or tenderness Rectal exam: Not done  The cervix was cleansed with Betadine solution. Hurricane spray on the cervix.  A single-tooth tenaculum was placed on the anterior cervical lip. The IUD removal clamp was inserted through  the cervix and the IUD arm was reach and clamped under US guidance and removed easily.  It was shown to the patient and discarded.  The completed the removal of the entire Paragard IUD. The single-tooth tenaculum was removed.  Hemostasis was adequate. The speculum was removed.  Repeat US after removal of the IUD arm did not show the IUD arm.   Assessment/Plan:  43 y.o. [id number]   1. Encounter for IUD removal Incomplete IUD removal on September 05, 2018 with 1 arm of the ParaGard IUD missing.  Successful removal of the arm of the ParaGard IUD under ultrasound guidance today.  Well-tolerated by the patient, no complication, patient reassured.  Patient decided to change to the progestin only birth control pill for contraception.  2. Encounter for initial prescription of contraceptive pills Decision to start on the progestin only  birth control pill.  Usage reviewed and prescription sent to pharmacy.  Other orders - norethindrone (MICRONOR) 0.35 MG tablet; Take 1 tablet (0.35 mg total) by mouth daily.  Counseling on above issues and coordination of care more than 50% for 15 minutes.  Princess Bruins MD, 11:04 AM 09/18/2018

## 2018-09-18 NOTE — Patient Instructions (Signed)
1. Encounter for IUD removal Incomplete IUD removal on September 05, 2018 with 1 arm of the ParaGard IUD missing.  Successful removal of the arm of the ParaGard IUD under ultrasound guidance today.  Well-tolerated by the patient, no complication, patient reassured.  Patient decided to change to the progestin only birth control pill for contraception.  2. Encounter for initial prescription of contraceptive pills Decision to start on the progestin only birth control pill.  Usage reviewed and prescription sent to pharmacy.  Other orders - norethindrone (MICRONOR) 0.35 MG tablet; Take 1 tablet (0.35 mg total) by mouth daily.  [place], fue un placer verle hoy!

## 2019-01-25 ENCOUNTER — Other Ambulatory Visit: Payer: Self-pay | Admitting: Obstetrics & Gynecology

## 2019-01-25 DIAGNOSIS — Z1231 Encounter for screening mammogram for malignant neoplasm of breast: Secondary | ICD-10-CM

## 2019-03-16 ENCOUNTER — Ambulatory Visit
Admission: RE | Admit: 2019-03-16 | Discharge: 2019-03-16 | Disposition: A | Discharge status: Home / Self Care | Payer: BC Managed Care – PPO | Source: Ambulatory Visit | Attending: Obstetrics & Gynecology | Admitting: Obstetrics & Gynecology

## 2019-03-16 ENCOUNTER — Other Ambulatory Visit: Payer: Self-pay

## 2019-03-16 DIAGNOSIS — Z1231 Encounter for screening mammogram for malignant neoplasm of breast: Secondary | ICD-10-CM

## 2019-05-05 ENCOUNTER — Other Ambulatory Visit: Payer: Self-pay

## 2019-05-05 ENCOUNTER — Emergency Department (HOSPITAL_COMMUNITY)
Admission: EM | Admit: 2019-05-05 | Discharge: 2019-05-05 | Disposition: A | Discharge status: Home / Self Care | Payer: BC Managed Care – PPO | Attending: Emergency Medicine | Admitting: Emergency Medicine

## 2019-05-05 ENCOUNTER — Encounter (HOSPITAL_COMMUNITY): Payer: Self-pay

## 2019-05-05 DIAGNOSIS — U071 COVID-19: Secondary | ICD-10-CM | POA: Insufficient documentation

## 2019-05-05 DIAGNOSIS — J029 Acute pharyngitis, unspecified: Secondary | ICD-10-CM | POA: Diagnosis present

## 2019-05-05 DIAGNOSIS — Z79899 Other long term (current) drug therapy: Secondary | ICD-10-CM | POA: Diagnosis not present

## 2019-05-05 DIAGNOSIS — R509 Fever, unspecified: Secondary | ICD-10-CM

## 2019-05-05 LAB — GROUP A STREP BY PCR: Group A Strep by PCR: NOT DETECTED

## 2019-05-05 NOTE — ED Provider Notes (Signed)
[facility] Provider Note   CSN: [id number] Arrival date & time: 05/05/19  1238     History Chief Complaint  Patient presents with  . Sore Throat  . Fever    Paige Wise is a 44 y.o. female presenting for evaluation of fever and sore throat.  Patient states yesterday she developed a sore throat.  Today she had a fever with a temperature up to 101.  She reports mildly nasal congestion, no cough.  She states her son had a sore throat last week, but has since been symptom-free.  She denies ear pain, sinus pressure, difficulty swallowing, neck pain, shortness breath, nausea, vomiting, abdominal pain.  She denies contact with known COVID-19 positive person.  She took 2 aspirin this morning which improved her fever and throat pain.  She states she has no other medical problems, takes no medications daily.  HPI     Past Medical History:  Diagnosis Date  . IUFD (intrauterine fetal death)    CORD ACCIDENT    Patient Active Problem List   Diagnosis Date Noted  . Weight gain 02/02/2012  . History of gestational diabetes 02/02/2012    Past Surgical History:  Procedure Laterality Date  . TONSILLECTOMY AND ADENOIDECTOMY       OB History    Gravida  5   Para  3   Term  3   Preterm      AB  2   Living  3     SAB  2   TAB      Ectopic      Multiple      Live Births  3           Family History  Problem Relation Age of Onset  . Hypertension Mother   . Diabetes Paternal Aunt   . Breast cancer Neg Hx     Social History   Tobacco Use  . Smoking status: Never Smoker  . Smokeless tobacco: Never Used  Substance Use Topics  . Alcohol use: No    Alcohol/week: 0.0 standard drinks  . Drug use: No    Home Medications Prior to Admission medications   Medication Sig Start Date End Date Taking? Authorizing Provider  betamethasone dipropionate (DIPROLENE) 0.05 % cream Apply topically 2 (two) times daily. Patient not  taking: Reported on 09/05/2018 09/04/18   Huel Cote, NP  norethindrone (MICRONOR) 0.35 MG tablet Take 1 tablet (0.35 mg total) by mouth daily. 09/18/18   Princess Bruins, MD    Allergies    Patient has no known allergies.  Review of Systems   Review of Systems  Constitutional: Positive for fever.  HENT: Positive for sore throat.     Physical Exam Updated Vital Signs BP 129/75 (BP Location: Right Arm)   Pulse 96   Temp 99.5 F (37.5 C) (Oral)   Resp 14   SpO2 96%   Physical Exam Vitals and nursing note reviewed.  Constitutional:      General: She is not in acute distress.    Appearance: She is well-developed.     Comments: Sitting comfortably in the chair no acute distress  HENT:     Head: Normocephalic and atraumatic.     Right Ear: Tympanic membrane, ear canal and external ear normal.     Left Ear: Tympanic membrane, ear canal and external ear normal.     Nose: No mucosal edema.     Right Sinus: No maxillary sinus tenderness or  frontal sinus tenderness.     Left Sinus: No maxillary sinus tenderness or frontal sinus tenderness.     Mouth/Throat:     Pharynx: Uvula midline.     Tonsils: No tonsillar exudate.     Comments: Mild OP erythema. Tonsils surgically absent.  Uvula midline with equal palate rise.  TMs nonerythematous nonbulging bilaterally.  No significant nasal congestion no mucosal edema. Eyes:     Conjunctiva/sclera: Conjunctivae normal.     Pupils: Pupils are equal, round, and reactive to light.  Cardiovascular:     Rate and Rhythm: Normal rate and regular rhythm.     Pulses: Normal pulses.  Pulmonary:     Effort: Pulmonary effort is normal.     Breath sounds: Normal breath sounds. No decreased breath sounds, wheezing, rhonchi or rales.     Comments: Speaking in full sentences.  Clear lung sounds in all fields. Abdominal:     General: There is no distension.     Palpations: Abdomen is soft. There is no mass.     Tenderness: There is no abdominal  tenderness. There is no guarding or rebound.  Musculoskeletal:        General: Normal range of motion.     Cervical back: Normal range of motion.  Lymphadenopathy:     Cervical: No cervical adenopathy.  Skin:    General: Skin is warm.     Capillary Refill: Capillary refill takes less than 2 seconds.  Neurological:     Mental Status: She is alert and oriented to person, place, and time.     ED Results / Procedures / Treatments   Labs (all labs ordered are listed, but only abnormal results are displayed) Labs Reviewed  GROUP A STREP BY PCR  NOVEL CORONAVIRUS, NAA (HOSP ORDER, SEND-OUT TO REF LAB; TAT 18-24 HRS)    EKG None  Radiology No results found.  Procedures Procedures (including critical care time)  Medications Ordered in ED Medications - No data to display  ED Course  I have reviewed the triage vital signs and the nursing notes.  Pertinent labs & imaging results that were available during my care of the patient were reviewed by me and considered in my medical decision making (see chart for details).    MDM Rules/Calculators/A&P                      Patient presenting with 1 day h/o st and fever. Physical exam reassuring, patient is afebrile and appears nontoxic.  Pulmonary exam reassuring.  Doubt pneumonia.  Okay mildly erythematous, tonsils have been removed.  Consider strep with main symptoms being sore throat and fever.  Strep is negative, likely viral, consider Covid.    Strep test negative.  As such, likely viral illness.  Will test for Covid and have patient treat symptomatically.  Patient to follow-up with primary care as needed.  At this time, patient appears safe for discharge.  Return precautions given.  Patient states he understands and agrees to plan.  [facility] was evaluated in [facility] on 05/05/2019 for the symptoms described in the history of present illness. She was evaluated in the context of the global COVID-19 pandemic, which  necessitated consideration that the patient might be at risk for infection with the SARS-CoV-2 virus that causes COVID-19. Institutional protocols and algorithms that pertain to the evaluation of patients at risk for COVID-19 are in a state of rapid change based on information released by regulatory bodies including the CDC  and federal and state organizations. These policies and algorithms were followed during the patient's care in the ED.  Final Clinical Impression(s) / ED Diagnoses Final diagnoses:  Sore throat  Fever, unspecified fever cause    Rx / DC Orders ED Discharge Orders    None       Alveria Apley, PA-C 05/05/19 1422    Terald Sleeper, MD 05/05/19 1549

## 2019-05-05 NOTE — ED Notes (Signed)
PT verbalizes understanding of DC information.

## 2019-05-05 NOTE — ED Triage Notes (Signed)
Pt reports sore throat and fever since yesterday morning, denies cough or SOB, fever 101 this morning, took aspirin at 830am for fever. Pt a.o, resp e.u, denies any known COVID contacts, states her son also have a sore throat.

## 2019-05-05 NOTE — Discharge Instructions (Signed)
You likely have a viral illness.  This should be treated symptomatically. You were tested for coronavirus today.  Results should return in 1 to 2 days.  If positive, you will receive a phone call.  If negative, you will not.  Either way, you may check online on MyChart. You should quarantine at home until results return. If positive, you will need to quarantine for total of 10 days from symptom onset (yesterday).  You can end quarantine if you are fever free and your symptoms are improving. If it is negative, you can end quarantine. Use Tylenol or ibuprofen as needed for fevers or body aches. Use honey, cough drops, or sore throat spray as needed for throat pain. Make sure you stay well-hydrated with water. Wash your hands frequently to prevent spread of infection. Return to the emergency room if you develop chest pain, difficulty breathing, or any new or worsening symptoms.

## 2019-05-06 LAB — NOVEL CORONAVIRUS, NAA (HOSP ORDER, SEND-OUT TO REF LAB; TAT 18-24 HRS): SARS-CoV-2, NAA: DETECTED — AB

## 2019-06-26 ENCOUNTER — Other Ambulatory Visit: Payer: Self-pay

## 2019-06-27 ENCOUNTER — Encounter: Payer: Self-pay | Admitting: Women's Health

## 2019-06-27 ENCOUNTER — Ambulatory Visit (INDEPENDENT_AMBULATORY_CARE_PROVIDER_SITE_OTHER): Payer: BC Managed Care – PPO | Admitting: Women's Health

## 2019-06-27 VITALS — BP 120/78 | Ht 59.0 in | Wt 134.0 lb

## 2019-06-27 DIAGNOSIS — N912 Amenorrhea, unspecified: Secondary | ICD-10-CM | POA: Diagnosis not present

## 2019-06-27 DIAGNOSIS — Z8632 Personal history of gestational diabetes: Secondary | ICD-10-CM

## 2019-06-27 DIAGNOSIS — Z01419 Encounter for gynecological examination (general) (routine) without abnormal findings: Secondary | ICD-10-CM

## 2019-06-27 NOTE — Addendum Note (Signed)
Addended by: Tito Dine on: 06/27/2019 03:08 PM   Modules accepted: Orders

## 2019-06-27 NOTE — Progress Notes (Signed)
Paige [facility] 04/03/1975 [id number]    History:    Presents for annual exam.  08/2018 ParaGard IUD removed, cycles were every 1 to 3 months  prior to removal,   last cycle November.  Normal mammogram history.  2019 Pap ASCUS with negative high-risk HPV.  04/2019 had Covid reports as a mild case sick for 1 week, had labs afterwards reports all tests were normal unsure what was tested.  History of normal lipid panel and CBC.   Past medical history, past surgical history, family history and social history were all reviewed and documented in the EPIC chart.  Works at [employer] ([employer]).  3 children ages 90-20, all doing well and have had Gardasil.  Parents hypertension.  Had gestational diabetes with second pregnancy.  ROS:  A ROS was performed and pertinent positives and negatives are included.  Exam:  Vitals:   06/27/19 1405  BP: 120/78  Weight: 134 lb (60.8 kg)  Height: 4\' 11"  (1.499 m)   Body mass index is 27.06 kg/m.   General appearance:  Normal Thyroid:  Symmetrical, normal in size, without palpable masses or nodularity. Respiratory  Auscultation:  Clear without wheezing or rhonchi Cardiovascular  Auscultation:  Regular rate, without rubs, murmurs or gallops  Edema/varicosities:  Not grossly evident Abdominal  Soft,nontender, without masses, guarding or rebound.  Liver/spleen:  No organomegaly noted  Hernia:  None appreciated  Skin  Inspection:  Grossly normal   Breasts: Examined lying and sitting.     Right: Without masses, retractions, discharge or axillary adenopathy.     Left: Without masses, retractions, discharge or axillary adenopathy. Gentitourinary   Inguinal/mons:  Normal without inguinal adenopathy  External genitalia:  Normal  BUS/Urethra/Skene's glands:  Normal  Vagina:  Normal  Cervix:  Normal  Uterus:   normal in size, shape and contour.  Midline and mobile  Adnexa/parametria:     Rt: Without masses or tenderness.   Lt: Without masses or  tenderness.  Anus and perineum: Normal  Digital rectal exam: Normal sphincter tone without palpated masses or tenderness  Assessment/Plan:  44 y.o. M HF G5, P3 for annual exam with no complaints of vaginal discharge, urinary symptoms, abdominal pain or dyspareunia.  Perimenopausal irregular cycles last cycle 5 months ago/withdrawal for contraception GDM with 1 pregnancy   Plan: Contraception options reviewed we will continue withdrawal declines further options.  We will check FSH and make a plan from there, currently having minimal menopausal symptoms.  SBEs, continue annual 3D screening mammogram, calcium rich food, vitamin D 2000 IUs daily encouraged.  2019 Pap ASCUS with negative high-risk HPV, repeat Pap taken today.  Covid vaccine discussed will wait 2 months prior to getting vaccine.  Glucose pending.    2020 [facility], 2:56 PM 06/27/2019

## 2019-06-27 NOTE — Patient Instructions (Addendum)
vit D 2000 iu daily Health Maintenance, Female Adopting a healthy lifestyle and getting preventive care are important in promoting health and wellness. Ask your health care provider about:  The right schedule for you to have regular tests and exams.  Things you can do on your own to prevent diseases and keep yourself healthy. What should I know about diet, weight, and exercise? Eat a healthy diet   Eat a diet that includes plenty of vegetables, fruits, low-fat dairy products, and lean protein.  Do not eat a lot of foods that are high in solid fats, added sugars, or sodium. Maintain a healthy weight Body mass index (BMI) is used to identify weight problems. It estimates body fat based on height and weight. Your health care provider can help determine your BMI and help you achieve or maintain a healthy weight. Get regular exercise Get regular exercise. This is one of the most important things you can do for your health. Most adults should:  Exercise for at least 150 minutes each week. The exercise should increase your heart rate and make you sweat (moderate-intensity exercise).  Do strengthening exercises at least twice a week. This is in addition to the moderate-intensity exercise.  Spend less time sitting. Even light physical activity can be beneficial. Watch cholesterol and blood lipids Have your blood tested for lipids and cholesterol at 46 years of age, then have this test every 5 years. Have your cholesterol levels checked more often if:  Your lipid or cholesterol levels are high.  You are older than 46 years of age.  You are at high risk for heart disease. What should I know about cancer screening? Depending on your health history and family history, you may need to have cancer screening at various ages. This may include screening for:  Breast cancer.  Cervical cancer.  Colorectal cancer.  Skin cancer.  Lung cancer. What should I know about heart disease, diabetes,  and high blood pressure? Blood pressure and heart disease  High blood pressure causes heart disease and increases the risk of stroke. This is more likely to develop in people who have high blood pressure readings, are of African descent, or are overweight.  Have your blood pressure checked: ? Every 3-5 years if you are 41-33 years of age. ? Every year if you are 79 years old or older. Diabetes Have regular diabetes screenings. This checks your fasting blood sugar level. Have the screening done:  Once every three years after age 79 if you are at a normal weight and have a low risk for diabetes.  More often and at a younger age if you are overweight or have a high risk for diabetes. What should I know about preventing infection? Hepatitis B If you have a higher risk for hepatitis B, you should be screened for this virus. Talk with your health care provider to find out if you are at risk for hepatitis B infection. Hepatitis C Testing is recommended for:  Everyone born from [birth year] through 1967.  Anyone with known risk factors for hepatitis C. Sexually transmitted infections (STIs)  Get screened for STIs, including gonorrhea and chlamydia, if: ? You are sexually active and are younger than 46 years of age. ? You are older than 46 years of age and your health care provider tells you that you are at risk for this type of infection. ? Your sexual activity has changed since you were last screened, and you are at increased risk for chlamydia or gonorrhea. Ask  gonorrhea. Ask your health care provider if you are at risk.  Ask your health care provider about whether you are at high risk for HIV. Your health care provider may recommend a prescription medicine to help prevent HIV infection. If you choose to take medicine to prevent HIV, you should first get tested for HIV. You should then be tested every 3 months for as long as you are taking the medicine. Pregnancy  If you are about to stop having your  period (premenopausal) and you may become pregnant, seek counseling before you get pregnant.  Take 400 to 800 micrograms (mcg) of folic acid every day if you become pregnant.  Ask for birth control (contraception) if you want to prevent pregnancy. Osteoporosis and menopause Osteoporosis is a disease in which the bones lose minerals and strength with aging. This can result in bone fractures. If you are 65 years old or older, or if you are at risk for osteoporosis and fractures, ask your health care provider if you should:  Be screened for bone loss.  Take a calcium or vitamin D supplement to lower your risk of fractures.  Be given hormone replacement therapy (HRT) to treat symptoms of menopause. Follow these instructions at home: Lifestyle  Do not use any products that contain nicotine or tobacco, such as cigarettes, e-cigarettes, and chewing tobacco. If you need help quitting, ask your health care provider.  Do not use street drugs.  Do not share needles.  Ask your health care provider for help if you need support or information about quitting drugs. Alcohol use  Do not drink alcohol if: ? Your health care provider tells you not to drink. ? You are pregnant, may be pregnant, or are planning to become pregnant.  If you drink alcohol: ? Limit how much you use to 0-1 drink a day. ? Limit intake if you are breastfeeding.  Be aware of how much alcohol is in your drink. In the [place] one drink equals one 12 oz bottle of beer (355 mL), one 5 oz glass of wine (148 mL), or one 1 oz glass of hard liquor (44 mL). General instructions  Schedule regular health, dental, and eye exams.  Stay current with your vaccines.  Tell your health care provider if: ? You often feel depressed. ? You have ever been abused or do not feel safe at home. Summary  Adopting a healthy lifestyle and getting preventive care are important in promoting health and wellness.  Follow your health care provider's  instructions about healthy diet, exercising, and getting tested or screened for diseases.  Follow your health care provider's instructions on monitoring your cholesterol and blood pressure. This information is not intended to replace advice given to you by your health care provider. Make sure you discuss any questions you have with your health care provider. Document Revised: 03/08/2018 Document Reviewed: 03/08/2018 Elsevier Patient Education  2020 [employer]vier Inc.  

## 2019-06-28 LAB — GLUCOSE, RANDOM: Glucose, Bld: 84 mg/dL (ref 65–99)

## 2019-06-28 LAB — FOLLICLE STIMULATING HORMONE: FSH: 53.8 m[IU]/mL

## 2019-07-02 LAB — PAP, TP IMAGING W/ HPV RNA, RFLX HPV TYPE 16,18/45: HPV DNA High Risk: NOT DETECTED

## 2019-07-17 NOTE — Telephone Encounter (Signed)
Spoke with patient and informed her. °

## 2019-11-02 ENCOUNTER — Ambulatory Visit (INDEPENDENT_AMBULATORY_CARE_PROVIDER_SITE_OTHER): Payer: BC Managed Care – PPO

## 2019-11-02 ENCOUNTER — Other Ambulatory Visit: Payer: Self-pay

## 2019-11-02 DIAGNOSIS — Z23 Encounter for immunization: Secondary | ICD-10-CM | POA: Diagnosis not present

## 2019-11-02 NOTE — Progress Notes (Signed)
COVID vaccine #1 per orders.Indications, contraindications and side effects of vaccine/vaccines discussed with parent and parent verbally expressed understanding and also agreed with the administration of vaccine/vaccines as ordered above today.Handout (VIS) given for each vaccine at this visit. ° °

## 2019-11-02 NOTE — Addendum Note (Signed)
Addended by: Estelle June on: 11/02/2019 01:50 PM   Modules accepted: Level of Service

## 2019-11-23 ENCOUNTER — Ambulatory Visit: Payer: BC Managed Care – PPO

## 2019-11-23 ENCOUNTER — Other Ambulatory Visit: Payer: Self-pay

## 2020-06-04 ENCOUNTER — Other Ambulatory Visit: Payer: Self-pay | Admitting: Nurse Practitioner

## 2020-06-04 DIAGNOSIS — Z1231 Encounter for screening mammogram for malignant neoplasm of breast: Secondary | ICD-10-CM

## 2020-06-25 ENCOUNTER — Other Ambulatory Visit: Payer: Self-pay | Admitting: Nurse Practitioner

## 2020-06-25 DIAGNOSIS — Z1231 Encounter for screening mammogram for malignant neoplasm of breast: Secondary | ICD-10-CM

## 2020-07-01 ENCOUNTER — Ambulatory Visit (INDEPENDENT_AMBULATORY_CARE_PROVIDER_SITE_OTHER): Payer: BC Managed Care – PPO | Admitting: Nurse Practitioner

## 2020-07-01 ENCOUNTER — Encounter: Payer: Self-pay | Admitting: Nurse Practitioner

## 2020-07-01 ENCOUNTER — Other Ambulatory Visit: Payer: Self-pay

## 2020-07-01 VITALS — BP 118/76 | Ht 60.0 in | Wt 134.0 lb

## 2020-07-01 DIAGNOSIS — Z01419 Encounter for gynecological examination (general) (routine) without abnormal findings: Secondary | ICD-10-CM

## 2020-07-01 DIAGNOSIS — N951 Menopausal and female climacteric states: Secondary | ICD-10-CM | POA: Diagnosis not present

## 2020-07-01 LAB — CBC WITH DIFFERENTIAL/PLATELET
Basophils Relative: 0.9 %
Lymphs Abs: 1950 cells/uL (ref 850–3900)
MCHC: 32.2 g/dL (ref 32.0–36.0)
RBC: 4.67 10*6/uL (ref 3.80–5.10)

## 2020-07-01 NOTE — Patient Instructions (Signed)

## 2020-07-01 NOTE — Progress Notes (Signed)
   Paige [facility] [birth year] [id number]   History:  45 y.o. [id number] presents for annual exam. No GYN complaints. Perimenopausal - LMP 5 months ago, mild hot flashes. FSH one year ago was 53. Normal pap and mammogram history.   Gynecologic History Patient's last menstrual period was 01/28/2020.   Contraception/Family planning: post menopausal status  Health Maintenance Last Pap: 06/27/2019. Results were: normal Last mammogram: 02/2019. Results were: normal Last colonoscopy: Never Last Dexa: N/A  Past medical history, past surgical history, family history and social history were all reviewed and documented in the EPIC chart. Married. 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   ROS:  A ROS was performed and pertinent positives and negatives are included.  Exam:  Vitals:   07/01/20 0851  BP: 118/76  Weight: 134 lb (60.8 kg)  Height: 5' (1.524 m)   Body mass index is 26.17 kg/m.  General appearance:  Normal Thyroid:  Symmetrical, normal in size, without palpable masses or nodularity. Respiratory  Auscultation:  Clear without wheezing or rhonchi Cardiovascular  Auscultation:  Regular rate, without rubs, murmurs or gallops  Edema/varicosities:  Not grossly evident Abdominal  Soft,nontender, without masses, guarding or rebound.  Liver/spleen:  No organomegaly noted  Hernia:  None appreciated  Skin  Inspection:  Grossly normal   Breasts: Examined lying and sitting.   Right: Without masses, retractions, discharge or axillary adenopathy.   Left: Without masses, retractions, discharge or axillary adenopathy. Gentitourinary   Inguinal/mons:  Normal without inguinal adenopathy  External genitalia:  Normal  BUS/Urethra/Skene's glands:  Normal  Vagina:  Normal  Cervix:  Normal  Uterus:  Normal in size, shape and contour.  Midline and mobile  Adnexa/parametria:     Rt: Without masses or tenderness.   Lt: Without masses or tenderness.  Anus and  perineum: Normal  Digital rectal exam: Normal sphincter tone without palpated masses or tenderness  Assessment/Plan:  45 y.o. [id number] for annual exam.   Well female exam with routine gynecological exam - Plan: CBC with Differential/Platelet, Comprehensive metabolic panel, Lipid panel. Education provided on SBEs, importance of preventative screenings, current guidelines, high calcium diet, regular exercise, and multivitamin daily.   Perimenopause - FSH one year ago was 53.  LMP 5 months ago, mild hot flashes. We discussed what to expect during perimenopause and menopause.  Screening for cervical cancer - Normal Pap history.  Will repeat at 5-year interval per guidelines.  Screening for breast cancer - Normal mammogram history.  Mammogram scheduled this month. Normal breast exam today.  Screening for colon cancer - we discussed current guidelines and importance of preventative screenings. No family history of colon cancer. Colonoscopy recommended and we discussed what to expect.   Return in 1 year for annual.    Olivia Mackie DNP, 8:58 AM 07/01/2020

## 2020-07-02 LAB — COMPREHENSIVE METABOLIC PANEL
AG Ratio: 1.7 (calc) (ref 1.0–2.5)
ALT: 15 U/L (ref 6–29)
AST: 21 U/L (ref 10–35)
Albumin: 4.5 g/dL (ref 3.6–5.1)
Alkaline phosphatase (APISO): 88 U/L (ref 31–125)
BUN: 17 mg/dL (ref 7–25)
CO2: 29 mmol/L (ref 20–32)
Calcium: 9.3 mg/dL (ref 8.6–10.2)
Chloride: 106 mmol/L (ref 98–110)
Creat: 0.78 mg/dL (ref 0.50–1.10)
Globulin: 2.6 g/dL (calc) (ref 1.9–3.7)
Glucose, Bld: 91 mg/dL (ref 65–99)
Potassium: 3.9 mmol/L (ref 3.5–5.3)
Sodium: 142 mmol/L (ref 135–146)
Total Bilirubin: 0.5 mg/dL (ref 0.2–1.2)
Total Protein: 7.1 g/dL (ref 6.1–8.1)

## 2020-07-02 LAB — CBC WITH DIFFERENTIAL/PLATELET
Absolute Monocytes: 371 cells/uL (ref 200–950)
Basophils Absolute: 48 cells/uL (ref 0–200)
Eosinophils Absolute: 90 cells/uL (ref 15–500)
Eosinophils Relative: 1.7 %
HCT: 41.3 % (ref 35.0–45.0)
Hemoglobin: 13.3 g/dL (ref 11.7–15.5)
MCH: 28.5 pg (ref 27.0–33.0)
MCV: 88.4 fL (ref 80.0–100.0)
MPV: 11.1 fL (ref 7.5–12.5)
Monocytes Relative: 7 %
Neutro Abs: 2841 cells/uL (ref 1500–7800)
Neutrophils Relative %: 53.6 %
Platelets: 203 10*3/uL (ref 140–400)
RDW: 12.4 % (ref 11.0–15.0)
Total Lymphocyte: 36.8 %
WBC: 5.3 10*3/uL (ref 3.8–10.8)

## 2020-07-02 LAB — LIPID PANEL
Cholesterol: 136 mg/dL (ref <200)
HDL: 47 mg/dL — ABNORMAL LOW (ref >=50)
LDL Cholesterol (Calc): 75 mg/dL (calc)
Non-HDL Cholesterol (Calc): 89 mg/dL (calc) (ref <130)
Total CHOL/HDL Ratio: 2.9 (calc) (ref <5.0)
Triglycerides: 63 mg/dL (ref <150)

## 2020-07-16 DIAGNOSIS — Z1231 Encounter for screening mammogram for malignant neoplasm of breast: Secondary | ICD-10-CM

## 2020-07-25 ENCOUNTER — Ambulatory Visit
Admission: RE | Admit: 2020-07-25 | Discharge: 2020-07-25 | Disposition: A | Discharge status: Home / Self Care | Payer: BC Managed Care – PPO | Source: Ambulatory Visit

## 2020-07-25 ENCOUNTER — Other Ambulatory Visit: Payer: Self-pay

## 2020-07-25 DIAGNOSIS — Z1231 Encounter for screening mammogram for malignant neoplasm of breast: Secondary | ICD-10-CM

## 2021-07-16 ENCOUNTER — Ambulatory Visit: Payer: BC Managed Care – PPO | Admitting: Nurse Practitioner

## 2021-07-22 NOTE — Progress Notes (Signed)
? ?  Paige Wise Feb 17, 1976 [id number] ? ? ?History:  46 y.o. [id number] presents for annual exam. No GYN complaints. Postmenopausal.. Normal pap and mammogram history.  ? ?Gynecologic History ?Patient's last menstrual period was 01/28/2020. ?  ?Contraception/Family planning: post menopausal status ?Sexually active: Yes ? ?Health Maintenance ?Last Pap: 06/27/2019. Results were: Normal, 5-year repeat ?Last mammogram: 07/25/2020. Results were: Normal ?Last colonoscopy: Never ?Last Dexa: N/A ? ?Past medical history, past surgical history, family history and social history were all reviewed and documented in the EPIC chart. Married. 70 yo son, 47 yo daughter at [facility] for psych, 35 yo daughter. 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  ? ?ROS:  A ROS was performed and pertinent positives and negatives are included. ? ?Exam: ? ?Vitals:  ? 07/23/21 0903  ?BP: 118/78  ?Weight: 138 lb (62.6 kg)  ?Height: 5' (1.524 m)  ? ? ?Body mass index is 26.95 kg/m?. ? ?General appearance:  Normal ?Thyroid:  Symmetrical, normal in size, without palpable masses or nodularity. ?Respiratory ? Auscultation:  Clear without wheezing or rhonchi ?Cardiovascular ? Auscultation:  Regular rate, without rubs, murmurs or gallops ? Edema/varicosities:  Not grossly evident ?Abdominal ? Soft,nontender, without masses, guarding or rebound. ? Liver/spleen:  No organomegaly noted ? Hernia:  None appreciated ? Skin ? Inspection:  Grossly normal ?  ?Breasts: Examined lying and sitting.  ? Right: Without masses, retractions, discharge or axillary adenopathy. ? ? Left: Without masses, retractions, discharge or axillary adenopathy. ?Genitourinary  ? Inguinal/mons:  Normal without inguinal adenopathy ? External genitalia:  Normal appearing vulva with no masses, tenderness, or lesions ? BUS/Urethra/Skene's glands:  Normal ? Vagina:  Normal appearing with normal color and discharge, no lesions ? Cervix:  Normal appearing without discharge or lesions ? Uterus:  Normal in size,  shape and contour.  Midline and mobile, nontender ? Adnexa/parametria:   ?  Rt: Normal in size, without masses or tenderness. ?  Lt: Normal in size, without masses or tenderness. ? Anus and perineum: Normal ? Digital rectal exam: Normal sphincter tone without palpated masses or tenderness ? ?Patient informed chaperone available to be present for breast and pelvic exam. Patient has requested no chaperone to be present. Patient has been advised what will be completed during breast and pelvic exam.  ? ?Assessment/Plan:  46 y.o. [id number] for annual exam.  ? ?Well female exam with routine gynecological exam - Plan: CBC with Differential/Platelet, Comprehensive metabolic panel. Education provided on SBEs, importance of preventative screenings, current guidelines, high calcium diet, regular exercise, and multivitamin daily.  Normal lipid panel last year. Plans to establish with PCP.  ? ?Postmenopausal - no HRT, no bleeding ? ?Screening for colon cancer - Plan: Cologuard. No family hx of colon cancer and now personal risk factors. Candidate for Cologuard.  ? ?Screening for cervical cancer - Normal Pap history.  Will repeat at 5-year interval per guidelines. ? ?Screening for breast cancer - Normal mammogram history.  Mammogram scheduled this month. Normal breast exam today. ? ?Return in 1 year for annual.  ? ? ? ? ?Olivia Mackie DNP, 9:24 AM 07/23/2021 ? ?

## 2021-07-23 ENCOUNTER — Encounter: Payer: Self-pay | Admitting: Nurse Practitioner

## 2021-07-23 ENCOUNTER — Ambulatory Visit (INDEPENDENT_AMBULATORY_CARE_PROVIDER_SITE_OTHER): Payer: BC Managed Care – PPO | Admitting: Nurse Practitioner

## 2021-07-23 VITALS — BP 118/78 | Ht 60.0 in | Wt 138.0 lb

## 2021-07-23 DIAGNOSIS — Z01419 Encounter for gynecological examination (general) (routine) without abnormal findings: Secondary | ICD-10-CM

## 2021-07-23 DIAGNOSIS — N951 Menopausal and female climacteric states: Secondary | ICD-10-CM

## 2021-07-23 DIAGNOSIS — Z780 Asymptomatic menopausal state: Secondary | ICD-10-CM

## 2021-07-23 DIAGNOSIS — Z1211 Encounter for screening for malignant neoplasm of colon: Secondary | ICD-10-CM

## 2021-07-24 LAB — CBC WITH DIFFERENTIAL/PLATELET
Absolute Monocytes: 415 cells/uL (ref 200–950)
Basophils Absolute: 62 cells/uL (ref 0–200)
Basophils Relative: 1 %
Eosinophils Absolute: 99 cells/uL (ref 15–500)
Eosinophils Relative: 1.6 %
HCT: 43.4 % (ref 35.0–45.0)
Hemoglobin: 14.1 g/dL (ref 11.7–15.5)
Lymphs Abs: 2672 cells/uL (ref 850–3900)
MCH: 29.2 pg (ref 27.0–33.0)
MCHC: 32.5 g/dL (ref 32.0–36.0)
MCV: 89.9 fL (ref 80.0–100.0)
MPV: 11.5 fL (ref 7.5–12.5)
Monocytes Relative: 6.7 %
Neutro Abs: 2951 cells/uL (ref 1500–7800)
Neutrophils Relative %: 47.6 %
Platelets: 225 10*3/uL (ref 140–400)
RBC: 4.83 10*6/uL (ref 3.80–5.10)
RDW: 12 % (ref 11.0–15.0)
Total Lymphocyte: 43.1 %
WBC: 6.2 10*3/uL (ref 3.8–10.8)

## 2021-07-24 LAB — COMPREHENSIVE METABOLIC PANEL
AG Ratio: 1.5 (calc) (ref 1.0–2.5)
ALT: 25 U/L (ref 6–29)
AST: 24 U/L (ref 10–35)
Albumin: 4.5 g/dL (ref 3.6–5.1)
Alkaline phosphatase (APISO): 87 U/L (ref 31–125)
BUN: 16 mg/dL (ref 7–25)
CO2: 26 mmol/L (ref 20–32)
Calcium: 9.4 mg/dL (ref 8.6–10.2)
Chloride: 104 mmol/L (ref 98–110)
Creat: 0.88 mg/dL (ref 0.50–0.99)
Globulin: 3 g/dL (calc) (ref 1.9–3.7)
Glucose, Bld: 89 mg/dL (ref 65–99)
Potassium: 4.8 mmol/L (ref 3.5–5.3)
Sodium: 140 mmol/L (ref 135–146)
Total Bilirubin: 0.3 mg/dL (ref 0.2–1.2)
Total Protein: 7.5 g/dL (ref 6.1–8.1)

## 2021-08-05 ENCOUNTER — Other Ambulatory Visit: Payer: Self-pay | Admitting: Nurse Practitioner

## 2021-08-05 DIAGNOSIS — Z1231 Encounter for screening mammogram for malignant neoplasm of breast: Secondary | ICD-10-CM

## 2021-08-13 ENCOUNTER — Ambulatory Visit
Admission: RE | Admit: 2021-08-13 | Discharge: 2021-08-13 | Disposition: A | Discharge status: Home / Self Care | Payer: BC Managed Care – PPO | Source: Ambulatory Visit | Attending: Nurse Practitioner | Admitting: Nurse Practitioner

## 2021-08-13 DIAGNOSIS — Z1231 Encounter for screening mammogram for malignant neoplasm of breast: Secondary | ICD-10-CM

## 2022-01-21 LAB — COLOGUARD: COLOGUARD: NEGATIVE

## 2022-07-06 ENCOUNTER — Ambulatory Visit: Payer: BC Managed Care – PPO | Admitting: Nurse Practitioner

## 2022-07-26 ENCOUNTER — Encounter: Payer: Self-pay | Admitting: Nurse Practitioner

## 2022-07-26 ENCOUNTER — Ambulatory Visit (INDEPENDENT_AMBULATORY_CARE_PROVIDER_SITE_OTHER): Payer: BC Managed Care – PPO | Admitting: Nurse Practitioner

## 2022-07-26 VITALS — BP 116/72 | HR 66 | Ht 59.0 in | Wt 140.0 lb

## 2022-07-26 DIAGNOSIS — Z01419 Encounter for gynecological examination (general) (routine) without abnormal findings: Secondary | ICD-10-CM | POA: Diagnosis not present

## 2022-07-26 DIAGNOSIS — Z780 Asymptomatic menopausal state: Secondary | ICD-10-CM

## 2022-07-26 NOTE — Progress Notes (Signed)
   Paige Wise 03/01/1976 [id number]   History:  47 y.o. [id number] presents for annual exam. No GYN complaints. Postmenopausal - no HRT, no bleeding. Normal pap and mammogram history. Has questions about shingles vaccine.   Gynecologic History Patient's last menstrual period was 01/28/2020.   Contraception/Family planning: post menopausal status Sexually active: Yes  Health Maintenance Last Pap: 06/27/2019. Results were: Normal neg HPV, 5-year repeat Last mammogram: 08/13/2021. Results were: Normal Last colonoscopy: Never. Negative Cologuard 02/13/2022 Last Dexa: Not indicated  Past medical history, past surgical history, family history and social history were all reviewed and documented in the EPIC chart. 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   ROS:  A ROS was performed and pertinent positives and negatives are included.  Exam:  Vitals:   07/26/22 0922  BP: 116/72  Pulse: 66  SpO2: 100%  Weight: 140 lb (63.5 kg)  Height: 4\' 11"  (1.499 m)     Body mass index is 28.28 kg/m.  General appearance:  Normal Thyroid:  Symmetrical, normal in size, without palpable masses or nodularity. Respiratory  Auscultation:  Clear without wheezing or rhonchi Cardiovascular  Auscultation:  Regular rate, without rubs, murmurs or gallops  Edema/varicosities:  Not grossly evident Abdominal  Soft,nontender, without masses, guarding or rebound.  Liver/spleen:  No organomegaly noted  Hernia:  None appreciated  Skin  Inspection:  Grossly normal   Breasts: Examined lying and sitting.   Right: Without masses, retractions, discharge or axillary adenopathy.   Left: Without masses, retractions, discharge or axillary adenopathy. Genitourinary   Inguinal/mons:  Normal without inguinal adenopathy  External genitalia:  Normal appearing vulva with no masses, tenderness, or lesions  BUS/Urethra/Skene's glands:  Normal  Vagina:  Normal  appearing with normal color and discharge, no lesions  Cervix:  Normal appearing without discharge or lesions  Uterus:  Normal in size, shape and contour.  Midline and mobile, nontender  Adnexa/parametria:     Rt: Normal in size, without masses or tenderness.   Lt: Normal in size, without masses or tenderness.  Anus and perineum: Normal  Digital rectal exam: Deferred  Patient informed chaperone available to be present for breast and pelvic exam. Patient has requested no chaperone to be present. Patient has been advised what will be completed during breast and pelvic exam.   Assessment/Plan:  47 y.o. [id number] for annual exam.   Well female exam with routine gynecological exam - Plan: CBC with Differential/Platelet, Comprehensive metabolic panel, Lipid panel. Education provided on SBEs, importance of preventative screenings, current guidelines, high calcium diet, regular exercise, and multivitamin daily. Educated on effectiveness and age for shingles vaccine.   Postmenopausal - no HRT, no bleeding  Screening for colon cancer - Negative Cologuard 01/2022. Will repeat at 3-year interval per recommendation.   Screening for cervical cancer - Normal Pap history.  Will repeat at 5-year interval per guidelines.  Screening for breast cancer - Normal mammogram history.  Continue annual screenings. Normal breast exam today.  Return in 1 year for annual.      Olivia Mackie DNP, 9:33 AM 07/26/2022

## 2022-07-27 LAB — CBC WITH DIFFERENTIAL/PLATELET
Absolute Monocytes: 413 cells/uL (ref 200–950)
Basophils Absolute: 39 cells/uL (ref 0–200)
Basophils Relative: 0.7 %
Eosinophils Absolute: 72 cells/uL (ref 15–500)
Eosinophils Relative: 1.3 %
HCT: 40.8 % (ref 35.0–45.0)
Hemoglobin: 13.2 g/dL (ref 11.7–15.5)
Lymphs Abs: 2030 cells/uL (ref 850–3900)
MCH: 28.8 pg (ref 27.0–33.0)
MCHC: 32.4 g/dL (ref 32.0–36.0)
MCV: 88.9 fL (ref 80.0–100.0)
MPV: 11.1 fL (ref 7.5–12.5)
Monocytes Relative: 7.5 %
Neutro Abs: 2948 cells/uL (ref 1500–7800)
Neutrophils Relative %: 53.6 %
Platelets: 213 10*3/uL (ref 140–400)
RBC: 4.59 10*6/uL (ref 3.80–5.10)
RDW: 12.4 % (ref 11.0–15.0)
Total Lymphocyte: 36.9 %
WBC: 5.5 10*3/uL (ref 3.8–10.8)

## 2022-07-27 LAB — COMPREHENSIVE METABOLIC PANEL
AG Ratio: 1.6 (calc) (ref 1.0–2.5)
ALT: 26 U/L (ref 6–29)
AST: 23 U/L (ref 10–35)
Albumin: 4.5 g/dL (ref 3.6–5.1)
Alkaline phosphatase (APISO): 83 U/L (ref 31–125)
BUN: 16 mg/dL (ref 7–25)
CO2: 29 mmol/L (ref 20–32)
Calcium: 9.2 mg/dL (ref 8.6–10.2)
Chloride: 107 mmol/L (ref 98–110)
Creat: 0.79 mg/dL (ref 0.50–0.99)
Globulin: 2.8 g/dL (calc) (ref 1.9–3.7)
Glucose, Bld: 98 mg/dL (ref 65–99)
Potassium: 4.3 mmol/L (ref 3.5–5.3)
Sodium: 142 mmol/L (ref 135–146)
Total Bilirubin: 0.5 mg/dL (ref 0.2–1.2)
Total Protein: 7.3 g/dL (ref 6.1–8.1)

## 2022-07-27 LAB — LIPID PANEL
Cholesterol: 151 mg/dL (ref <200)
HDL: 50 mg/dL (ref >=50)
LDL Cholesterol (Calc): 82 mg/dL (calc)
Non-HDL Cholesterol (Calc): 101 mg/dL (calc) (ref <130)
Total CHOL/HDL Ratio: 3 (calc) (ref <5.0)
Triglycerides: 92 mg/dL (ref <150)

## 2022-08-27 ENCOUNTER — Other Ambulatory Visit: Payer: Self-pay | Admitting: Nurse Practitioner

## 2022-08-27 DIAGNOSIS — Z1231 Encounter for screening mammogram for malignant neoplasm of breast: Secondary | ICD-10-CM

## 2022-09-15 ENCOUNTER — Ambulatory Visit: Payer: BC Managed Care – PPO | Admitting: Dermatology

## 2022-09-24 ENCOUNTER — Ambulatory Visit
Admission: RE | Admit: 2022-09-24 | Discharge: 2022-09-24 | Disposition: A | Discharge status: Home / Self Care | Payer: BC Managed Care – PPO | Source: Ambulatory Visit

## 2022-09-24 DIAGNOSIS — Z1231 Encounter for screening mammogram for malignant neoplasm of breast: Secondary | ICD-10-CM

## 2022-11-18 ENCOUNTER — Ambulatory Visit: Payer: BC Managed Care – PPO | Admitting: Dermatology

## 2023-01-20 ENCOUNTER — Encounter: Payer: Self-pay | Admitting: Dermatology

## 2023-01-20 ENCOUNTER — Ambulatory Visit: Payer: BC Managed Care – PPO | Admitting: Dermatology

## 2023-01-20 VITALS — BP 128/84 | HR 69

## 2023-01-20 DIAGNOSIS — L811 Chloasma: Secondary | ICD-10-CM

## 2023-01-20 MED ORDER — SAFETY SEAL MISCELLANEOUS MISC: 1.0000 | Freq: Every evening | 2 refills | Status: DC

## 2023-01-20 NOTE — Progress Notes (Signed)
   New Patient Visit   Subjective  Paige Wise is a 47 y.o. female who presents for the following: dark spots on face  Patient states she has dark marks located on the right cheek that she would like to have examined. Patient reports the areas have been there for 19 years. She reports the areas are not bothersome.Patient rates irritation 0 out of 10. She states that the areas have not spread. Patient reports she has not previously been treated for these areas. Patient denies Hx of bx. Patient denies family history of skin cancer(s).  The patient has spots, moles and lesions to be evaluated, some may be new or changing and the patient may have concern these could be cancer.   The following portions of the chart were reviewed this encounter and updated as appropriate: medications, allergies, medical history  Review of Systems:  No other skin or systemic complaints except as noted in HPI or Assessment and Plan.  Objective  Well appearing patient in no apparent distress; mood and affect are within normal limits.   A focused examination was performed of the following areas: face   Relevant exam findings are noted in the Assessment and Plan.            Assessment & Plan   MELASMA Exam: reticulated hyperpigmented patches at face  Not at goal  Melasma is a chronic; persistent condition of hyperpigmented patches generally on the face, worse in summer due to higher UV exposure.    Heredity; thyroid disease; sun exposure; pregnancy; birth control pills; epilepsy medication and darker skin may predispose to Melasma.   Recommendations include: - Sun avoidance and daily broad spectrum (UVA/UVB) tinted mineral sunscreen SPF 30+, with Zinc or Titanium Dioxide. - Rx topical bleaching creams (i.e. hydroquinone) is a common treatment but should not be used long term.  Hydroquinones may be mixed with retinoids; vitamin C; steroids; Kojic Acid.  Treatment Plan:  a. Sun protection: Advise  the patient to use sunscreen daily and reapply every three hours when exposed to the sun. Provide samples of La Roche-Posay Double Repair Tolerane with UV Protection and recommend Isentree [place] sunscreen (blue cap) available on [employer].   b. Lightening cream: Prescribe lightening cream from [facility] compounding pharmacy containing tranexamic acid, kojic acid, and tretinoin. Instruct the patient to apply a pea-sized amount every other night for three weeks, then increase to nightly application if tolerated. Apply moisturizer on top of the cream.   c. Moisturizer: Provide samples of LRP Tularine Double Repair nighttime moisturizer for the patient to use. Patient may continue using Peach and Lily [place] cream if preferred.   d. Follow-up: Schedule a follow-up appointment in three months to assess improvement and adjust the treatment plan if necessary. Take baseline photographs for comparison at the follow-up visit.  Melasma  Related Medications Safety Seal Miscellaneous MISC Apply 1 Application topically at bedtime. Medication Name: Melaxemic Cream Tranexamic acid 5% Kojic acid USP 2% Vit V USP 2.5% Tretinoin USP 0.025% Hyaluronic acid EXCP 0.1%    No follow-ups on file.    Documentation: I have reviewed the above documentation for accuracy and completeness, and I agree with the above.   I, Shirron Marcha Solders, CMA, am acting as scribe for [employer], DO.   Langston Reusing, DO

## 2023-01-20 NOTE — Patient Instructions (Addendum)
Hello Paige Wise,  Thank you for visiting my office today. Your dedication to enhancing the health of your skin is greatly appreciated. Below is a summary of our discussion and the outlined treatment plan for your melasma:  - Sunscreen Use:   - Product Recommendations: La Roche-Posay Double Repair Tolerane with UV Protection and Isentree with the blue cap, both available on [employer].   - Application: Apply every day, ensuring to reapply if you continue to be exposed to the sun.  - Lightening Cream:   - Prescription: Obtain your lightening cream from [facility] compounding pharmacy.   - Ingredients: Contains tranexamic acid, kojic acid, tretinoin, among others.   - Usage: Start with every other night, progressing to nightly after three weeks if well-tolerated.   - Duration: Expected to last 1.5 to 2 months.   - Cost: Approximately $40.  - Moisturizer:   - Application: Use a pea-sized amount of your current moisturizer, Peach and Lily, or the provided nighttime moisturizer samples after applying the lightening cream.  - Follow-Up:   - Next Steps: We will schedule a follow-up appointment in three months to evaluate progress and adjust your treatment plan as necessary.   - Assessment: We will also compare your skin condition with the photographs taken today.  Please do not hesitate to reach out with any questions or concerns regarding your treatment. I am looking forward to observing the improvements at your next visit.  Warm regards,  Dr. Lang[facility] Reusing [facility]   Recommended Sunscreen   Important Information  Due to recent changes in healthcare laws, you may see results of your pathology and/or laboratory studies on MyChart before the doctors have had a chance to review them. We understand that in some cases there may be results that are confusing or concerning to you. Please understand that not all results are received at the same time and often the doctors may need to interpret multiple  results in order to provide you with the best plan of care or course of treatment. Therefore, we ask that you please give [place] 2 business days to thoroughly review all your results before contacting the office for clarification. Should we see a critical lab result, you will be contacted sooner.   If You Need Anything After Your Visit  If you have any questions or concerns for your doctor, please call our main line at [phone] If no one answers, please leave a voicemail as directed and we will return your call as soon as possible. Messages left after 4 pm will be answered the following business day.   You may also send [place] a message via MyChart. We typically respond to MyChart messages within 1-2 business days.  For prescription refills, please ask your pharmacy to contact our office. Our fax number is [phone].  If you have an urgent issue when the clinic is closed that cannot wait until the next business day, you can page your doctor at the number below.    Please note that while we do our best to be available for urgent issues outside of office hours, we are not available 24/7.   If you have an urgent issue and are unable to reach [place], you may choose to seek medical care at your doctor's office, retail clinic, urgent care center, or emergency room.  If you have a medical emergency, please immediately call 911 or go to the emergency department. In the event of inclement weather, please call our main line at [phone] for an update on  the status of any delays or closures.  Dermatology Medication Tips: Please keep the boxes that topical medications come in in order to help keep track of the instructions about where and how to use these. Pharmacies typically print the medication instructions only on the boxes and not directly on the medication tubes.   If your medication is too expensive, please contact our office at [phone] or send [place] a message through MyChart.   We are unable to  tell what your co-pay for medications will be in advance as this is different depending on your insurance coverage. However, we may be able to find a substitute medication at lower cost or fill out paperwork to get insurance to cover a needed medication.   If a prior authorization is required to get your medication covered by your insurance company, please allow [place] 1-2 business days to complete this process.  Drug prices often vary depending on where the prescription is filled and some pharmacies may offer cheaper prices.  The website www.goodrx.com contains coupons for medications through different pharmacies. The prices here do not account for what the cost may be with help from insurance (it may be cheaper with your insurance), but the website can give you the price if you did not use any insurance.  - You can print the associated coupon and take it with your prescription to the pharmacy.  - You may also stop by our office during regular business hours and pick up a GoodRx coupon card.  - If you need your prescription sent electronically to a different pharmacy, notify our office through [employer] or by phone at [phone]

## 2023-03-03 IMAGING — MG MM DIGITAL SCREENING BILAT W/ TOMO AND CAD
8 series · 9 of 24 positions shown · non-contrast
Comparison: Previous exam(s).

CLINICAL DATA: Screening.

EXAM:
DIGITAL SCREENING BILATERAL MAMMOGRAM WITH TOMOSYNTHESIS AND CAD
TECHNIQUE: Bilateral screening digital craniocaudal and mediolateral oblique
mammograms were obtained. Bilateral screening digital breast
tomosynthesis was performed. The images were evaluated with
computer-aided detection.

[L CC synth-2D]
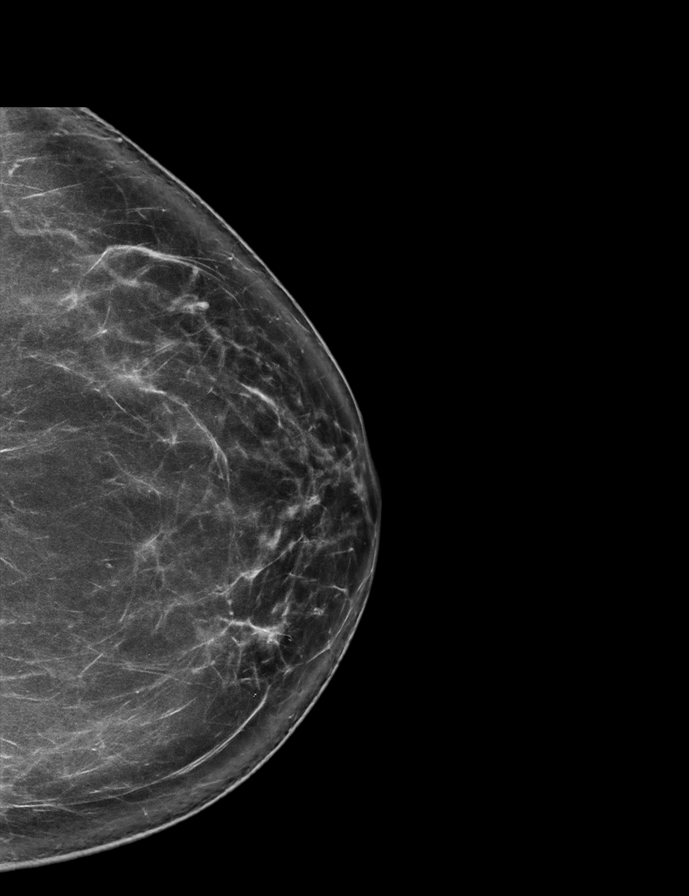

[R CC synth-2D]
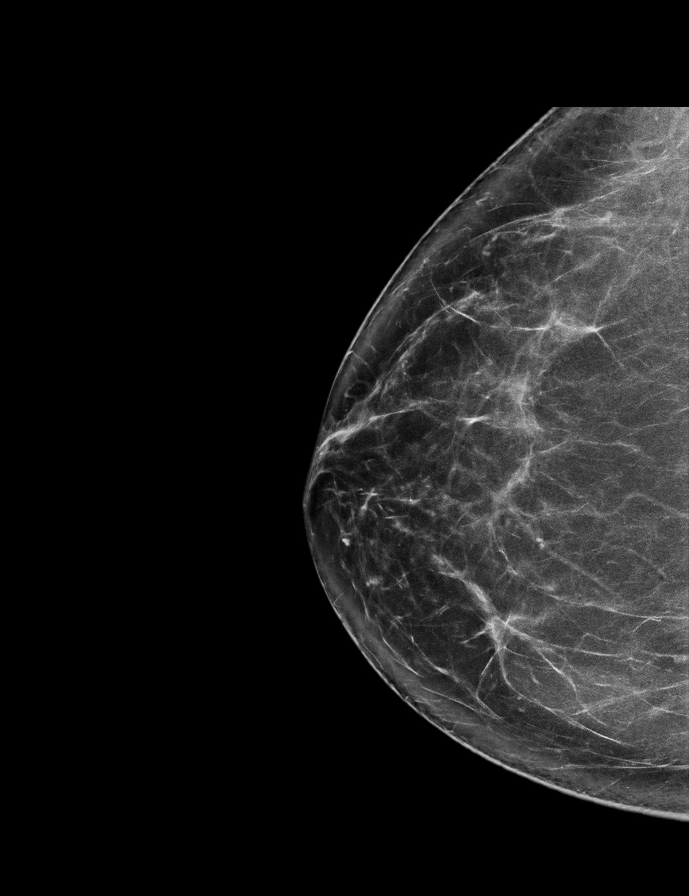

[R MLO synth-2D]
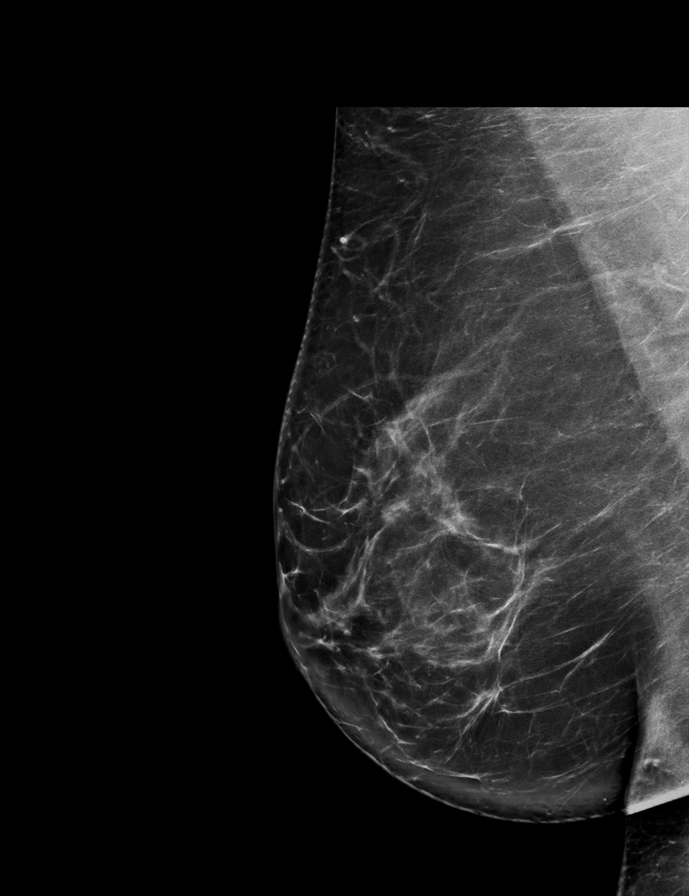

[L MLO synth-2D]
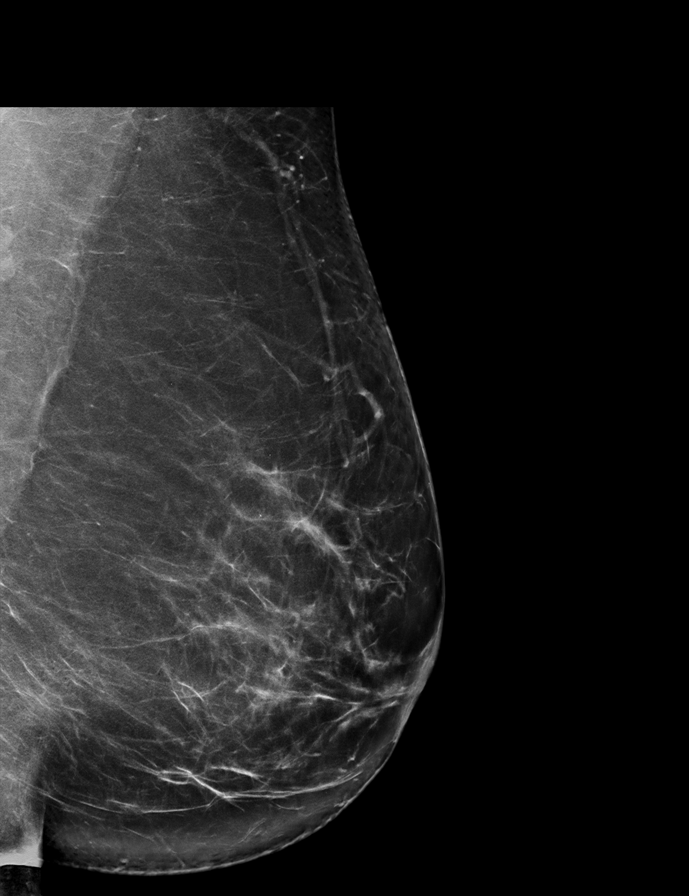

[L CC tomo · 2 of 77 frames shown]
[frame 25/77]
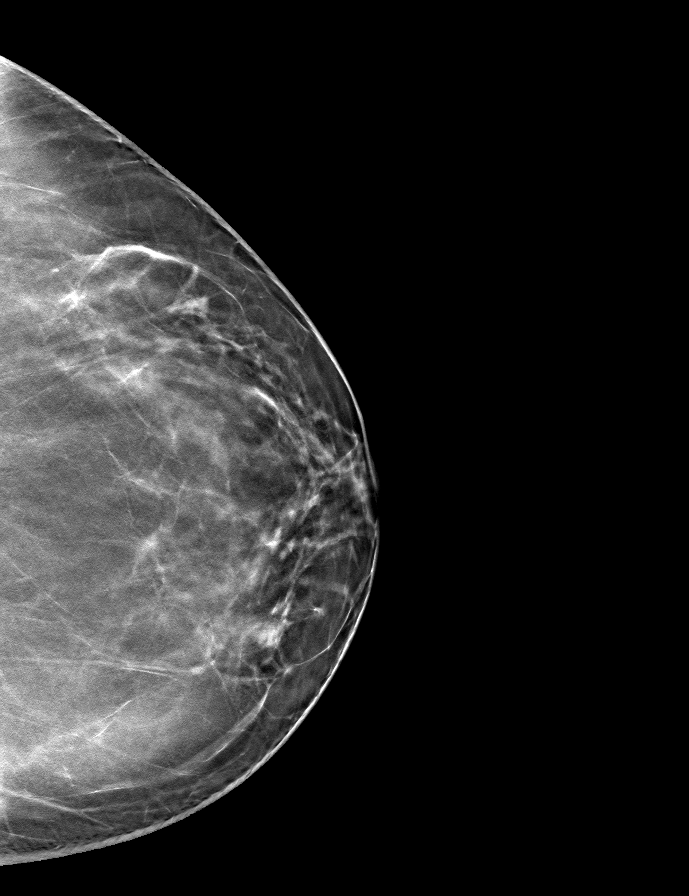
[frame 39/77]
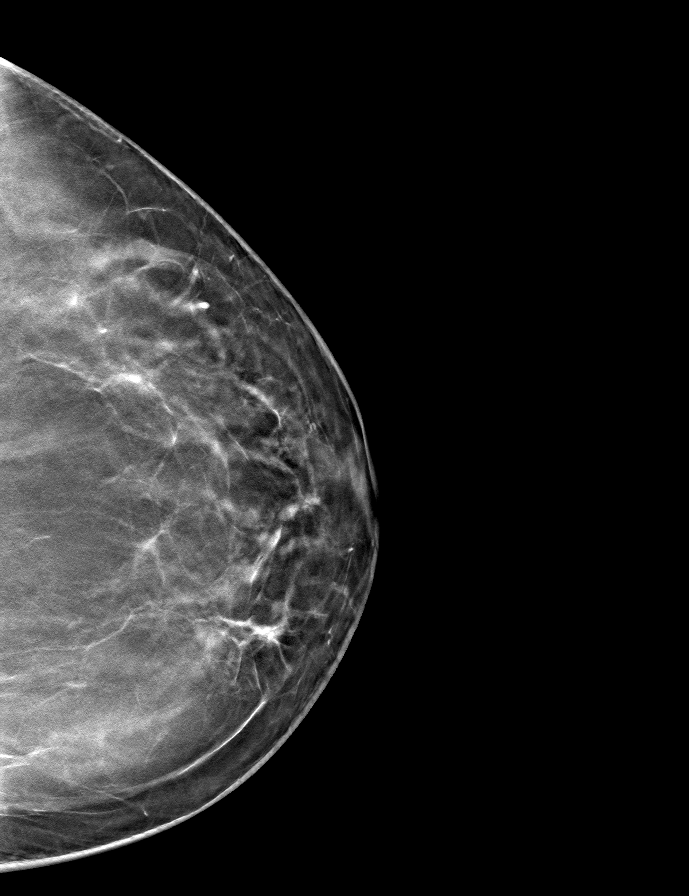

[R MLO tomo · tomo slice 41/82.0]
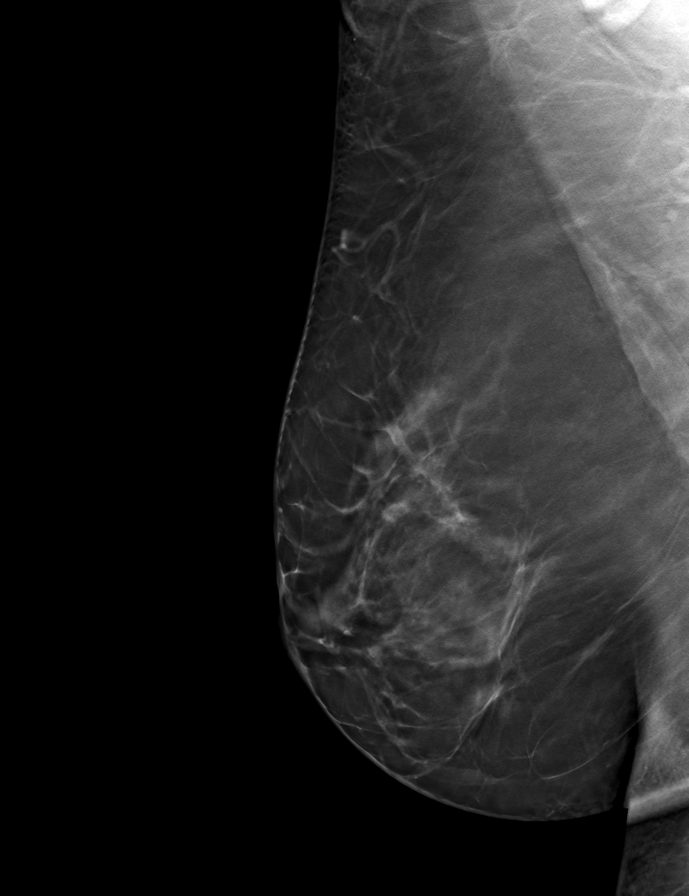

[R CC tomo · tomo slice 39/76.0]
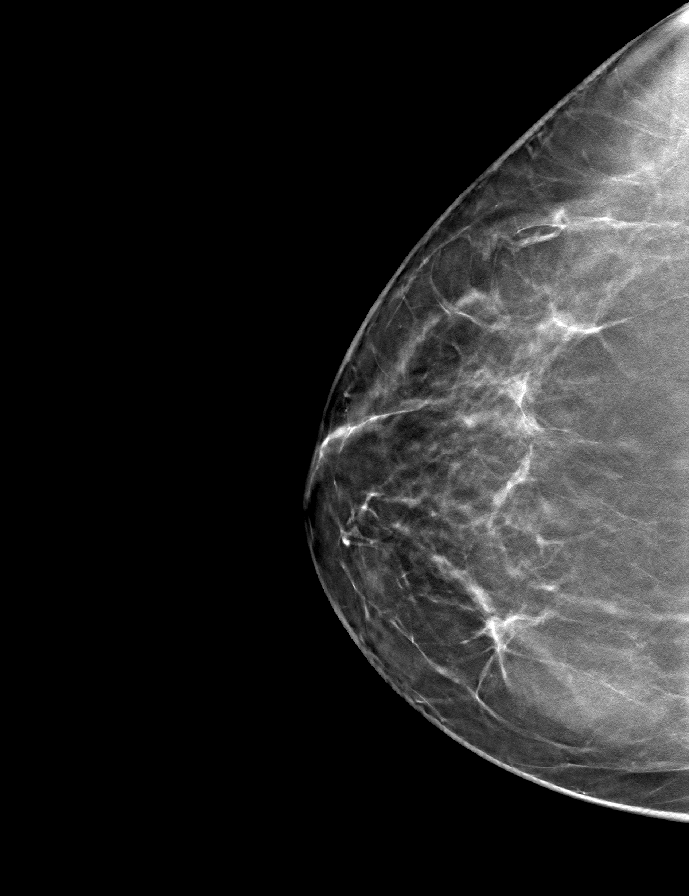

[L MLO tomo · tomo slice 41/81.0]
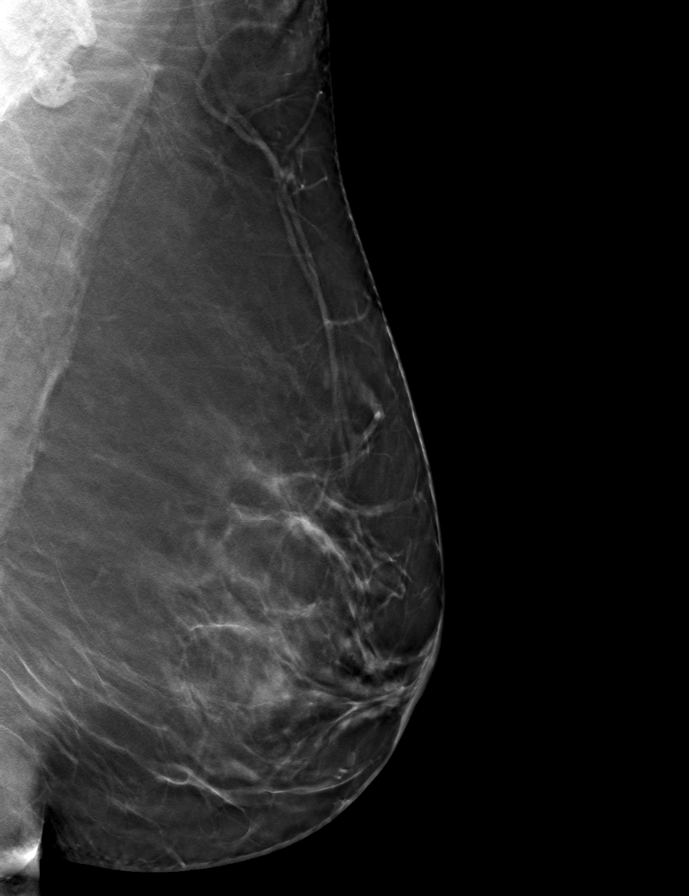

[9 of 24 positions shown; findings below may reference images not displayed]

ACR Breast Density Category b: There are scattered areas of
fibroglandular density.
FINDINGS: There are no findings suspicious for malignancy.
IMPRESSION: No mammographic evidence of malignancy. A result letter of this
screening mammogram will be mailed directly to the patient.

RECOMMENDATION:
Screening mammogram in one year. (Code:51-O-LD2)

BI-RADS CATEGORY  1: Negative.

## 2023-04-15 ENCOUNTER — Telehealth: Payer: Self-pay

## 2023-04-15 NOTE — Telephone Encounter (Signed)
Pt LVM in triage line stating that she had a question and would like a cb.   Spoke w/ the pt and she was very embarrassed/shy to report that she just found out that her spouse was cheating and wants to have a full STI work up.   Provided the pt with my sincerest apologies.  Only wants to see her provider about this.   Pt advised and reassured that unfortunately this is not the first time we have seen a case/circumstance like this and that it is also very common request for women to get STI screenings for all kinds of reasons. All care is handled with [place] with our utmost discretion due to health care laws/ethics/morality.   Pt scheduled by me for 04/18/2022 @ 0845, arrival at 0830.   Pt voiced appreciation for my sympathy & understanding during our phone call.  Routing to provider for final review. Encounter closed.

## 2023-04-19 ENCOUNTER — Ambulatory Visit: Payer: BC Managed Care – PPO | Admitting: Nurse Practitioner

## 2023-04-21 ENCOUNTER — Ambulatory Visit (INDEPENDENT_AMBULATORY_CARE_PROVIDER_SITE_OTHER): Payer: BC Managed Care – PPO | Admitting: Nurse Practitioner

## 2023-04-21 VITALS — BP 128/70 | HR 88 | Ht 59.5 in | Wt 133.0 lb

## 2023-04-21 DIAGNOSIS — F419 Anxiety disorder, unspecified: Secondary | ICD-10-CM | POA: Diagnosis not present

## 2023-04-21 DIAGNOSIS — G4700 Insomnia, unspecified: Secondary | ICD-10-CM | POA: Diagnosis not present

## 2023-04-21 DIAGNOSIS — Z113 Encounter for screening for infections with a predominantly sexual mode of transmission: Secondary | ICD-10-CM | POA: Diagnosis not present

## 2023-04-21 NOTE — Progress Notes (Signed)
   Acute Office Visit  Subjective:    Patient ID: Paige Wise, female    DOB: 1975-12-11, 48 y.o.   MRN: [id number]   HPI 48 y.o. presents today for STD screening. Recently found out husband was unfaithful. No symptoms. She has been experiencing some anxiety that is also affecting her sleep. Has trouble falling asleep and sometimes she will wake up with a racing heart. Some nights she is only getting a couple of hours of sleep. It has not started affecting her during the day. She still feels rested. Has tried Melatonin 5 mg.   Patient's last menstrual period was 01/28/2020.    Review of Systems  Constitutional: Negative.   Genitourinary: Negative.   Psychiatric/Behavioral:  Positive for sleep disturbance. The patient is nervous/anxious.        Objective:    Physical Exam Constitutional:      Appearance: Normal appearance.  Genitourinary:    General: Normal vulva.     Vagina: Normal.     Cervix: Normal.     BP 128/70   Pulse 88   Ht 4' 11.5" (1.511 m)   Wt 133 lb (60.3 kg)   LMP 01/28/2020   SpO2 98%   BMI 26.41 kg/m  Wt Readings from Last 3 Encounters:  04/21/23 133 lb (60.3 kg)  07/26/22 140 lb (63.5 kg)  07/23/21 138 lb (62.6 kg)        Patient informed chaperone available to be present for breast and/or pelvic exam. Patient has requested no chaperone to be present. Patient has been advised what will be completed during breast and pelvic exam.   Assessment & Plan:   Problem List Items Addressed This Visit   None Visit Diagnoses       Screening examination for STD (sexually transmitted disease)    -  Primary   Relevant Orders   SURESWAB CT/NG/T. vaginalis   RPR   HIV Antibody (routine testing w rflx)   Hepatitis C antibody     Insomnia, unspecified type         Anxiety          Plan: STD panel pending. Discussed options to help with sleep to include natural supplements and prescription medications. Not interested in prescriptions at this time.  Will increase Melatonin. Also discussed management of situational anxiety and she is not interested in medication management at this time but will reach out if she decides to pursue. Individual or couples therapy recommended.   Return if symptoms worsen or fail to improve.    Olivia Mackie DNP, 8:53 AM 04/21/2023

## 2023-04-22 LAB — HIV ANTIBODY (ROUTINE TESTING W REFLEX): HIV 1&2 Ab, 4th Generation: NONREACTIVE

## 2023-04-22 LAB — HEPATITIS C ANTIBODY: Hepatitis C Ab: NONREACTIVE

## 2023-04-22 LAB — RPR: RPR Ser Ql: NONREACTIVE

## 2023-04-23 LAB — SURESWAB CT/NG/T. VAGINALIS
C. trachomatis RNA, TMA: NOT DETECTED
N. gonorrhoeae RNA, TMA: NOT DETECTED
Trichomonas vaginalis RNA: NOT DETECTED

## 2023-04-25 ENCOUNTER — Encounter: Payer: Self-pay | Admitting: Nurse Practitioner

## 2023-04-26 ENCOUNTER — Ambulatory Visit: Payer: BC Managed Care – PPO | Admitting: Dermatology

## 2023-06-30 ENCOUNTER — Encounter: Payer: Self-pay | Admitting: Dermatology

## 2023-06-30 ENCOUNTER — Ambulatory Visit: Payer: BC Managed Care – PPO | Admitting: Dermatology

## 2023-06-30 DIAGNOSIS — Z7189 Other specified counseling: Secondary | ICD-10-CM

## 2023-06-30 DIAGNOSIS — L811 Chloasma: Secondary | ICD-10-CM

## 2023-06-30 MED ORDER — SAFETY SEAL MISCELLANEOUS MISC: 1.0000 | Freq: Two times a day (BID) | 4 refills | Status: DC

## 2023-06-30 NOTE — Progress Notes (Signed)
   Follow-Up Visit   Subjective  Paige Wise is a 48 y.o. female who presents for the following: face Pt here following up on melasma that she was seen for on 01/20/23.   Patient presents for follow-up of melasma treatment initiated a couple of months ago. The patient reports using the prescribed topical medication, with improvement noted on the left cheek. The right cheek has shown some improvement, but a small area appears darker. The patient admits to inconsistent sunscreen use, primarily applying it when the sun is visible and during outdoor activities. She acknowledges not using sunscreen daily, especially in winter months. The patient is currently using a [place] brand moisturizer, which she finds effective for her skin.  The following portions of the chart were reviewed this encounter and updated as appropriate: medications, allergies, medical history  Review of Systems:  No other skin or systemic complaints except as noted in HPI or Assessment and Plan.  Objective  Well appearing patient in no apparent distress; mood and affect are within normal limits.  A focused examination was performed of the following areas: face  Relevant exam findings are noted in the Assessment and Plan.    Assessment & Plan   MELASMA Exam: reticulated hyperpigmented patches at face  Chronic and persistent condition with duration or expected duration over one year. Condition is symptomatic/ bothersome to patient. Not currently at goal.   Pt Education Discussed During Visit: Melasma is a chronic; persistent condition of hyperpigmented patches generally on the face, worse in summer due to higher UV exposure.    Heredity; thyroid disease; sun exposure; pregnancy; birth control pills; epilepsy medication and darker skin may predispose to Melasma.    - Assessment: Patient has been using a topical treatment for melasma prescribed a couple of months ago. Comparison with previous photos shows improvement  on the left cheek, while the right cheek has a darker spot. Patient's inconsistent use of sunscreen, especially in winter, is noted as a concern since melasma is highly sensitive to sun exposure.  - Plan:    Continue current topical treatment from [facility] (prescription refilled with multiple refills)    Add Eucerin Radiant Tone with thiamidol to current regimen, to be applied morning and night along with MedRock cream    Use mineral sunscreen daily, samples of Neutrogena Invisible Daily Defense provided    Apply both lightening creams (MedRock and Excedrin Radiant Tone) morning and night, followed by sunscreen in the morning    Continue current moisturizer if skin feels dry    Emphasize importance of daily sunscreen use, even on cloudy days or while driving, to prevent recurrence or worsening of melasma    No follow-ups on file.  Owens Shark, CMA, am acting as scribe for [employer], DO.   Documentation: I have reviewed the above documentation for accuracy and completeness, and I agree with the above.  Langston Reusing, DO

## 2023-06-30 NOTE — Patient Instructions (Addendum)
 Hello Thersia,  Thank you for visiting today. Here is a summary of the key instructions:  Diagnosis: Melasma  - Medications:   - Apply MedRock lightning  cream all over face morning and night   - Apply Eucerin Radiant Tone serum all over face morning and night   - Use both creams together, order doesn't matter  - Skincare:   - Use sunscreen every day, even when cloudy or driving   - Try Neutrogena Invisible Daily Defense mineral sunscreen (samples provided)   - Continue using current moisturizer if skin feels dry  - Follow-up:   - Return for follow-up appointment in 4 months  Please reach out if you have any questions or concerns.  Best Regards,  Dr. Langston Reusing, Dermatology         Important Information   Due to recent changes in healthcare laws, you may see results of your pathology and/or laboratory studies on MyChart before the doctors have had a chance to review them. We understand that in some cases there may be results that are confusing or concerning to you. Please understand that not all results are received at the same time and often the doctors may need to interpret multiple results in order to provide you with the best plan of care or course of treatment. Therefore, we ask that you please give [place] 2 business days to thoroughly review all your results before contacting the office for clarification. Should we see a critical lab result, you will be contacted sooner.     If You Need Anything After Your Visit   If you have any questions or concerns for your doctor, please call our main line at [phone]. If no one answers, please leave a voicemail as directed and we will return your call as soon as possible. Messages left after 4 pm will be answered the following business day.    You may also send [place] a message via MyChart. We typically respond to MyChart messages within 1-2 business days.  For prescription refills, please ask your pharmacy to contact our  office. Our fax number is [phone].  If you have an urgent issue when the clinic is closed that cannot wait until the next business day, you can page your doctor at the number below.     Please note that while we do our best to be available for urgent issues outside of office hours, we are not available 24/7.    If you have an urgent issue and are unable to reach [place], you may choose to seek medical care at your doctor's office, retail clinic, urgent care center, or emergency room.   If you have a medical emergency, please immediately call 911 or go to the emergency department. In the event of inclement weather, please call our main line at [phone] for an update on the status of any delays or closures.  Dermatology Medication Tips: Please keep the boxes that topical medications come in in order to help keep track of the instructions about where and how to use these. Pharmacies typically print the medication instructions only on the boxes and not directly on the medication tubes.   If your medication is too expensive, please contact our office at [phone] or send [place] a message through MyChart.    We are unable to tell what your co-pay for medications will be in advance as this is different depending on your insurance coverage. However, we may be able to find a substitute medication at lower  cost or fill out paperwork to get insurance to cover a needed medication.    If a prior authorization is required to get your medication covered by your insurance company, please allow [place] 1-2 business days to complete this process.   Drug prices often vary depending on where the prescription is filled and some pharmacies may offer cheaper prices.   The website www.goodrx.com contains coupons for medications through different pharmacies. The prices here do not account for what the cost may be with help from insurance (it may be cheaper with your insurance), but the website can give you the price if  you did not use any insurance.  - You can print the associated coupon and take it with your prescription to the pharmacy.  - You may also stop by our office during regular business hours and pick up a GoodRx coupon card.  - If you need your prescription sent electronically to a different pharmacy, notify our office through [facility] or by phone at [phone]

## 2023-07-27 ENCOUNTER — Ambulatory Visit (INDEPENDENT_AMBULATORY_CARE_PROVIDER_SITE_OTHER): Payer: BC Managed Care – PPO | Admitting: Nurse Practitioner

## 2023-07-27 ENCOUNTER — Encounter: Payer: Self-pay | Admitting: Nurse Practitioner

## 2023-07-27 VITALS — BP 116/82 | HR 73 | Ht 59.25 in | Wt 138.0 lb

## 2023-07-27 DIAGNOSIS — Z13220 Encounter for screening for lipoid disorders: Secondary | ICD-10-CM

## 2023-07-27 DIAGNOSIS — Z1331 Encounter for screening for depression: Secondary | ICD-10-CM | POA: Diagnosis not present

## 2023-07-27 DIAGNOSIS — Z780 Asymptomatic menopausal state: Secondary | ICD-10-CM

## 2023-07-27 DIAGNOSIS — Z01419 Encounter for gynecological examination (general) (routine) without abnormal findings: Secondary | ICD-10-CM

## 2023-07-27 NOTE — Progress Notes (Signed)
 Paige Wise 01-09-1976 [id number]   History:  48 y.o. [id number] presents for annual exam. No GYN complaints. Self treating for ring worm on her thigh. Has improved. Postmenopausal - no HRT, no bleeding. Normal pap history.   Gynecologic History Patient's last menstrual period was 01/28/2020.   Contraception/Family planning: post menopausal status Sexually active: Yes  Health Maintenance Last Pap: 06/27/2019. Results were: Normal neg HPV Last mammogram: 09/24/2022. Results were: Normal Last colonoscopy: Never. Negative Cologuard 02/13/2022 Last Dexa: Not indicated     07/27/2023    9:57 AM  Depression screen PHQ 2/9  Decreased Interest 0  Down, Depressed, Hopeless 0  PHQ - 2 Score 0     Past medical history, past surgical history, family history and social history were all reviewed and documented in the EPIC chart. 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 60 yo son, 80 yo daughter at [facility] for psych, 38 yo daughter.   ROS:  A ROS was performed and pertinent positives and negatives are included.  Exam:  Vitals:   07/27/23 0955  BP: 116/82  Pulse: 73  SpO2: 99%  Weight: 138 lb (62.6 kg)  Height: 4' 11.25" (1.505 m)     Body mass index is 27.64 kg/m.  General appearance:  Normal Thyroid :  Symmetrical, normal in size, without palpable masses or nodularity. Respiratory  Auscultation:  Clear without wheezing or rhonchi Cardiovascular  Auscultation:  Regular rate, without rubs, murmurs or gallops  Edema/varicosities:  Not grossly evident Abdominal  Soft,nontender, without masses, guarding or rebound.  Liver/spleen:  No organomegaly noted  Hernia:  None appreciated  Skin  Inspection:  Grossly normal   Breasts: Examined lying and sitting.   Right: Without masses, retractions, discharge or axillary adenopathy.   Left: Without masses, retractions, discharge or axillary adenopathy. Pelvic: External genitalia:  no lesions              Urethra:  normal appearing  urethra with no masses, tenderness or lesions              Bartholins and Skenes: normal                 Vagina: normal appearing vagina with normal color and discharge, no lesions              Cervix: no lesions Bimanual Exam:  Uterus:  no masses or tenderness              Adnexa: no mass, fullness, tenderness              Rectovaginal: Deferred              Anus:  normal, no lesions  Patient informed chaperone available to be present for breast and pelvic exam. Patient has requested no chaperone to be present. Patient has been advised what will be completed during breast and pelvic exam.   Assessment/Plan:  48 y.o. [id number] for annual exam.   Well female exam with routine gynecological exam - Plan: CBC with Differential/Platelet, Comprehensive metabolic panelEducation provided on SBEs, importance of preventative screenings, current guidelines, high calcium diet, regular exercise, and multivitamin daily. Educated on effectiveness and age for shingles vaccine.   Postmenopausal - no HRT, no bleeding  Screening for lipid disorders - Plan: Lipid panel  Screening for colon cancer - Negative Cologuard 01/2022  Screening for cervical cancer - Normal Pap history.  Will repeat at 5-year interval per guidelines.  Screening for breast cancer - Normal mammogram history.  Continue annual  screenings. Normal breast exam today.  Return in about 1 year (around 07/26/2024) for Annual.     Andee Bamberger DNP, 10:15 AM 07/27/2023

## 2023-07-28 ENCOUNTER — Encounter: Payer: Self-pay | Admitting: Nurse Practitioner

## 2023-07-28 LAB — COMPREHENSIVE METABOLIC PANEL WITH GFR
AG Ratio: 1.8 (calc) (ref 1.0–2.5)
ALT: 19 U/L (ref 6–29)
AST: 20 U/L (ref 10–35)
Albumin: 4.5 g/dL (ref 3.6–5.1)
Alkaline phosphatase (APISO): 81 U/L (ref 31–125)
BUN: 22 mg/dL (ref 7–25)
CO2: 30 mmol/L (ref 20–32)
Calcium: 9.1 mg/dL (ref 8.6–10.2)
Chloride: 103 mmol/L (ref 98–110)
Creat: 0.8 mg/dL (ref 0.50–0.99)
Globulin: 2.5 g/dL (ref 1.9–3.7)
Glucose, Bld: 74 mg/dL (ref 65–99)
Potassium: 3.9 mmol/L (ref 3.5–5.3)
Sodium: 138 mmol/L (ref 135–146)
Total Bilirubin: 0.5 mg/dL (ref 0.2–1.2)
Total Protein: 7 g/dL (ref 6.1–8.1)
eGFR: 90 mL/min/{1.73_m2} (ref >=60)

## 2023-07-28 LAB — CBC WITH DIFFERENTIAL/PLATELET
Absolute Lymphocytes: 2564 {cells}/uL (ref 850–3900)
Absolute Monocytes: 510 {cells}/uL (ref 200–950)
Basophils Absolute: 52 {cells}/uL (ref 0–200)
Basophils Relative: 0.9 %
Eosinophils Absolute: 133 {cells}/uL (ref 15–500)
Eosinophils Relative: 2.3 %
HCT: 42.2 % (ref 35.0–45.0)
Hemoglobin: 13.4 g/dL (ref 11.7–15.5)
MCH: 28.6 pg (ref 27.0–33.0)
MCHC: 31.8 g/dL — ABNORMAL LOW (ref 32.0–36.0)
MCV: 90 fL (ref 80.0–100.0)
MPV: 10.7 fL (ref 7.5–12.5)
Monocytes Relative: 8.8 %
Neutro Abs: 2540 {cells}/uL (ref 1500–7800)
Neutrophils Relative %: 43.8 %
Platelets: 213 10*3/uL (ref 140–400)
RBC: 4.69 10*6/uL (ref 3.80–5.10)
RDW: 12.9 % (ref 11.0–15.0)
Total Lymphocyte: 44.2 %
WBC: 5.8 10*3/uL (ref 3.8–10.8)

## 2023-07-28 LAB — LIPID PANEL
Cholesterol: 142 mg/dL (ref <200)
HDL: 53 mg/dL (ref >=50)
LDL Cholesterol (Calc): 73 mg/dL
Non-HDL Cholesterol (Calc): 89 mg/dL (ref <130)
Total CHOL/HDL Ratio: 2.7 (calc) (ref <5.0)
Triglycerides: 79 mg/dL (ref <150)

## 2023-09-05 ENCOUNTER — Other Ambulatory Visit: Payer: Self-pay | Admitting: Nurse Practitioner

## 2023-09-05 DIAGNOSIS — Z1231 Encounter for screening mammogram for malignant neoplasm of breast: Secondary | ICD-10-CM

## 2023-09-08 ENCOUNTER — Encounter

## 2023-09-08 DIAGNOSIS — Z1231 Encounter for screening mammogram for malignant neoplasm of breast: Secondary | ICD-10-CM

## 2023-10-03 ENCOUNTER — Ambulatory Visit

## 2023-10-10 ENCOUNTER — Other Ambulatory Visit: Payer: Self-pay | Admitting: Nurse Practitioner

## 2023-10-10 DIAGNOSIS — Z1231 Encounter for screening mammogram for malignant neoplasm of breast: Secondary | ICD-10-CM

## 2023-10-21 ENCOUNTER — Ambulatory Visit
Admission: RE | Admit: 2023-10-21 | Discharge: 2023-10-21 | Disposition: A | Discharge status: Home / Self Care | Source: Ambulatory Visit

## 2023-10-21 DIAGNOSIS — Z1231 Encounter for screening mammogram for malignant neoplasm of breast: Secondary | ICD-10-CM

## 2023-11-09 ENCOUNTER — Encounter: Payer: Self-pay | Admitting: Dermatology

## 2023-11-09 ENCOUNTER — Ambulatory Visit (INDEPENDENT_AMBULATORY_CARE_PROVIDER_SITE_OTHER): Admitting: Dermatology

## 2023-11-09 VITALS — BP 136/83

## 2023-11-09 DIAGNOSIS — D492 Neoplasm of unspecified behavior of bone, soft tissue, and skin: Secondary | ICD-10-CM | POA: Diagnosis not present

## 2023-11-09 DIAGNOSIS — L811 Chloasma: Secondary | ICD-10-CM

## 2023-11-09 DIAGNOSIS — D2239 Melanocytic nevi of other parts of face: Secondary | ICD-10-CM

## 2023-11-09 DIAGNOSIS — D485 Neoplasm of uncertain behavior of skin: Secondary | ICD-10-CM

## 2023-11-09 MED ORDER — SAFETY SEAL MISCELLANEOUS MISC: 3 refills | Status: DC

## 2023-11-09 NOTE — Patient Instructions (Addendum)
 Date: Wed Nov 09 2023  [facility],  Thank you for visiting today. Here is a summary of the key instructions:  - Medications:   - Use hydroquinone 12% with kojic acid at night for 4 months   - After 4 months, switch back to transaminic acid  - Skin Care:   - Morning: wash face, apply Eucerin  Radiant Tone, apply sunscreen   - Night: wash face, apply Eucerin Radiant Tone, apply hydroquinone 12% with kojic acid, apply moisturizer   - Continue using CeraVe or try Centella sunscreen  - Treatment Areas:   - Keep Aquaphor on the biopsy site every morning and night   - Apply a large amount of Aquaphor for faster healing  - Follow-up:   - Return in 4 months  Please reach out if you have any questions or concerns.  Warm regards,  Dr. Delon Lenis Dermatology          Patient Handout: Wound Care for Skin Biopsy Site  Taking Care of Your Skin Biopsy Site  Proper care of the biopsy site is essential for promoting healing and minimizing scarring. This handout provides instructions on how to care for your biopsy site to ensure optimal recovery.  1. Cleaning the Wound:  Clean the biopsy site daily with gentle soap and water. Gently pat the area dry with a clean, soft towel. Avoid harsh scrubbing or rubbing the area, as this can irritate the skin and delay healing.  2. Applying Aquaphor and Bandage:  After cleaning the wound, apply a thin layer of Aquaphor ointment to the biopsy site. Cover the area with a sterile bandage to protect it from dirt, bacteria, and friction. Change the bandage daily or as needed if it becomes soiled or wet.  3. Continued Care for One Week:  Repeat the cleaning, Aquaphor application, and bandaging process daily for one week following the biopsy procedure. Keeping the wound clean and moist during this initial healing period will help prevent infection and promote optimal healing.  4. Massaging Aquaphor into the Area:  ---After one week,  discontinue the use of bandages but continue to apply Aquaphor to the biopsy site. ----Gently massage the Aquaphor into the area using circular motions. ---Massaging the skin helps to promote circulation and prevent the formation of scar tissue.   Additional Tips:  Avoid exposing the biopsy site to direct sunlight during the healing process, as this can cause hyperpigmentation or worsen scarring. If you experience any signs of infection, such as increased redness, swelling, warmth, or drainage from the wound, contact your healthcare provider immediately. Follow any additional instructions provided by your healthcare provider for caring for the biopsy site and managing any discomfort. Conclusion:  Taking proper care of your skin biopsy site is crucial for ensuring optimal healing and minimizing scarring. By following these instructions for cleaning, applying Aquaphor, and massaging the area, you can promote a smooth and successful recovery. If you have any questions or concerns about caring for your biopsy site, don't hesitate to contact your healthcare provider for guidance.     Important Information  Due to recent changes in healthcare laws, you may see results of your pathology and/or laboratory studies on MyChart before the doctors have had a chance to review them. We understand that in some cases there may be results that are confusing or concerning to you. Please understand that not all results are received at the same time and often the doctors may need to interpret multiple results in order to  provide you with the best plan of care or course of treatment. Therefore, we ask that you please give us  2 business days to thoroughly review all your results before contacting the office for clarification. Should we see a critical lab result, you will be contacted sooner.   If You Need Anything After Your Visit  If you have any questions or concerns for your doctor, please call our main line at  [phone] If no one answers, please leave a voicemail as directed and we will return your call as soon as possible. Messages left after 4 pm will be answered the following business day.   You may also send us  a message via MyChart. We typically respond to MyChart messages within 1-2 business days.  For prescription refills, please ask your pharmacy to contact our office. Our fax number is [phone].  If you have an urgent issue when the clinic is closed that cannot wait until the next business day, you can page your doctor at the number below.    Please note that while we do our best to be available for urgent issues outside of office hours, we are not available 24/7.   If you have an urgent issue and are unable to reach us , you may choose to seek medical care at your doctor's office, retail clinic, urgent care center, or emergency room.  If you have a medical emergency, please immediately call 911 or go to the emergency department. In the event of inclement weather, please call our main line at [phone] for an update on the status of any delays or closures.  Dermatology Medication Tips: Please keep the boxes that topical medications come in in order to help keep track of the instructions about where and how to use these. Pharmacies typically print the medication instructions only on the boxes and not directly on the medication tubes.   If your medication is too expensive, please contact our office at [phone] or send us  a message through MyChart.   We are unable to tell what your co-pay for medications will be in advance as this is different depending on your insurance coverage. However, we may be able to find a substitute medication at lower cost or fill out paperwork to get insurance to cover a needed medication.   If a prior authorization is required to get your medication covered by your insurance company, please allow us  1-2 business days to complete this process.  Drug  prices often vary depending on where the prescription is filled and some pharmacies may offer cheaper prices.  The website www.goodrx.com contains coupons for medications through different pharmacies. The prices here do not account for what the cost may be with help from insurance (it may be cheaper with your insurance), but the website can give you the price if you did not use any insurance.  - You can print the associated coupon and take it with your prescription to the pharmacy.  - You may also stop by our office during regular business hours and pick up a GoodRx coupon card.  - If you need your prescription sent electronically to a different pharmacy, notify our office through [facility] or by phone at [phone]

## 2023-11-09 NOTE — Progress Notes (Signed)
 Follow-Up Visit   Subjective  Paige Wise is a 48 y.o. female established patient who presents for FOLLOW UP on the diagnoses listed below:  Patient was last evaluated on 06/30/23.    Melasma: Prescribed Medrock melaxemic cream - applying BID. Recommended Eucerin RT serum - using BID & daily SPF use.  Patient reports sxs are better as she is seeing improvement in pigment.    Are you nursing, pregnant or trying to conceive? No   The following portions of the chart were reviewed this encounter and updated as appropriate: medications, allergies, medical history  Review of Systems:  No other skin or systemic complaints except as noted in HPI or Assessment and Plan.  Objective  Well appearing patient in no apparent distress; mood and affect are within normal limits.   A focused examination was performed of the following areas: face   Relevant exam findings are noted in the Assessment and Plan.            Left Alar Crease 2mm papule  Assessment & Plan   1. Melasma - Assessment: Patient has been using Eucerin Radiant Tone and a MedRock compound containing hydroquinone for hyperpigmentation. Now that summer is over, a more aggressive treatment approach with higher concentration hydroquinone is being initiated to target the remaining dark spots. - Plan:    Continue Eucerin Radiant Tone daily morning and night    Start hydroquinone 12% with kojic acid at nighttime for 4 months, then switch back to transaminic acid    Morning regimen: wash face, apply Eucerin Radiant Tone, then sunscreen    Night regimen: wash face, apply Excedrin Radiant Tone, hydroquinone, then moisturizer    Discontinue current MedRock compound containing hydroquinone, but retain for future use    Consider switching to Centella sunscreen for increased hydration    Continue diligent sunscreen use, especially at the beach, to prevent worsening of dark spots  2. Left Nasal Crease Papule - Assessment: 3mm  papule noted in the left nasal crease. Differential diagnosis includes basal cell carcinoma versus benign lesion or fibrous papule. Shave biopsy performed to rule out malignancy. - Plan:    Shave biopsy of 3mm left nasal crease papule performed with specimen sent to lab for pathological examination    Apply Aquaphor ointment to biopsy site twice daily for 1-2 weeks or until healed    Band-Aid application optional    Anticipate biopsy results in approximately one week to be communicated via patient portal message  Follow-up in 4 months to assess response to hyperpigmentation treatment modifications.    NEOPLASM OF UNCERTAIN BEHAVIOR OF SKIN Left Alar Crease Skin / nail biopsy Type of biopsy: tangential   Informed consent: discussed and consent obtained   Timeout: patient name, date of birth, surgical site, and procedure verified   Procedure prep:  Patient was prepped and draped in usual sterile fashion Prep type:  Isopropyl alcohol Anesthesia: the lesion was anesthetized in a standard fashion   Anesthetic:  1% lidocaine w/ epinephrine 1-100,000 buffered w/ 8.4% NaHCO3 Instrument used: DermaBlade   Hemostasis achieved with: aluminum chloride   Outcome: patient tolerated procedure well   Post-procedure details: sterile dressing applied and wound care instructions given   Dressing type: petrolatum gauze and bandage    Specimen 1 - Surgical pathology Differential Diagnosis:  r/o BCC v fibrous papule  Check Margins: yes  No follow-ups on file.   Documentation: I have reviewed the above documentation for accuracy and completeness, and I agree with the  above.  I, Shirron Maranda, CMA, am acting as scribe for Delon Lenis, DO.   Delon Lenis, DO

## 2023-11-11 LAB — SURGICAL PATHOLOGY

## 2023-11-14 ENCOUNTER — Ambulatory Visit: Payer: Self-pay | Admitting: Dermatology

## 2024-01-04 ENCOUNTER — Ambulatory Visit (INDEPENDENT_AMBULATORY_CARE_PROVIDER_SITE_OTHER): Admitting: Nurse Practitioner

## 2024-01-04 ENCOUNTER — Encounter: Payer: Self-pay | Admitting: Nurse Practitioner

## 2024-01-04 VITALS — BP 116/64 | HR 78

## 2024-01-04 DIAGNOSIS — K625 Hemorrhage of anus and rectum: Secondary | ICD-10-CM | POA: Diagnosis not present

## 2024-01-04 DIAGNOSIS — K5900 Constipation, unspecified: Secondary | ICD-10-CM | POA: Diagnosis not present

## 2024-01-04 DIAGNOSIS — K644 Residual hemorrhoidal skin tags: Secondary | ICD-10-CM

## 2024-01-04 NOTE — Progress Notes (Signed)
   Acute Office Visit  Subjective:    Patient ID: Paige Wise, female    DOB: 1976/01/11, 48 y.o.   MRN: [id number]   HPI 48 y.o. presents today for constipation. This occurred 2 weeks ago. Constipation is not new for her and she has been adjusting her diet to help with this. Had small amount of blood with bowel movements until about 4 days ago. Only with wiping. No pain or itching. No changes in medications. Bowel movements are occurring daily now.   Patient's last menstrual period was 01/28/2020.    Review of Systems  Constitutional: Negative.   Gastrointestinal:  Positive for anal bleeding and constipation.       Objective:    Physical Exam Constitutional:      Appearance: Normal appearance.  Genitourinary:    Rectum: External hemorrhoid (Non-bleeding, non-erythematous) present.     BP 116/64   Pulse 78   LMP 01/28/2020   SpO2 97%  Wt Readings from Last 3 Encounters:  07/27/23 138 lb (62.6 kg)  04/21/23 133 lb (60.3 kg)  07/26/22 140 lb (63.5 kg)        Assessment & Plan:   Problem List Items Addressed This Visit   None Visit Diagnoses       Constipation, unspecified constipation type    -  Primary     External hemorrhoid         Painless rectal bleeding          Plan: Non-bleeding, non-erythematous external hemorrhoid. Discussed causes for constipation and management options. Stool softeners and/or Miralax as needed, increase fiber and water intake. May apply preparation-H to hemorrhoid if needed. If no improvement with home remedies will reach out of GI referral.   Return if symptoms worsen or fail to improve.    Annabella DELENA Shutter DNP, 1:51 PM 01/04/2024

## 2024-01-23 ENCOUNTER — Ambulatory Visit: Admitting: Family Medicine

## 2024-01-23 ENCOUNTER — Encounter: Payer: Self-pay | Admitting: Family Medicine

## 2024-01-23 VITALS — BP 120/82 | HR 74 | Temp 97.8°F | Ht 59.25 in | Wt 141.4 lb

## 2024-01-23 DIAGNOSIS — K5909 Other constipation: Secondary | ICD-10-CM | POA: Diagnosis not present

## 2024-01-23 DIAGNOSIS — K640 First degree hemorrhoids: Secondary | ICD-10-CM

## 2024-01-23 DIAGNOSIS — Z23 Encounter for immunization: Secondary | ICD-10-CM | POA: Diagnosis not present

## 2024-01-23 NOTE — Progress Notes (Signed)
 New Patient Visit  Subjective:     Patient ID: Paige Wise, female    DOB: [birth year], 48 y.o.   MRN: [id number]  Chief Complaint  Patient presents with   Establish Care    HPI  Discussed the use of AI scribe software for clinical note transcription with the patient, who gave verbal consent to proceed.  History of Present Illness Paige Wise is a 48 year old female who presents with rectal bleeding and hemorrhoids.  Rectal bleeding - Present for approximately two months - Occurs during bowel movements - Blood visible on stool and toilet paper - No large amounts of blood in the toilet  Hemorrhoidal symptoms - Hemorrhoids present - Associated with rectal bleeding  Bowel habits and laxative use - Uses Colace, but misunderstood instructions and took each medication option separately rather than together - Fluid intake is inconsistent and insufficient, which may affect treatment efficacy     ROS Per HPI  Outpatient Encounter Medications as of 01/23/2024  Medication Sig   Multiple Vitamin (MULTIVITAMIN) tablet Take 1 tablet by mouth daily.   Safety Seal Miscellaneous MISC Hydroquinolone 12% kojic acid 2% cream - apply to face nightly.   No facility-administered encounter medications on file as of 01/23/2024.    Past Medical History:  Diagnosis Date   IUFD (intrauterine fetal death)    CORD ACCIDENT    Past Surgical History:  Procedure Laterality Date   TONSILLECTOMY AND ADENOIDECTOMY      Family History  Problem Relation Age of Onset   Hypertension Mother    Diabetes Sister    Diabetes Paternal Aunt    Breast cancer Neg Hx    BRCA 1/2 Neg Hx     Social History   Socioeconomic History   Marital status: Married    Spouse name: Not on file   Number of children: Not on file   Years of education: Not on file   Highest education level: Not on file  Occupational History   Not on file  Tobacco Use   Smoking status: Never   Smokeless tobacco:  Never  Vaping Use   Vaping status: Never Used  Substance and Sexual Activity   Alcohol use: No    Alcohol/week: 0.0 standard drinks of alcohol   Drug use: No   Sexual activity: Yes    Partners: Male    Birth control/protection: Post-menopausal    Comment: . 1st intercourse- 19,  partners- 1   Other Topics Concern   Not on file  Social History Narrative   Not on file   Social Drivers of Health   Financial Resource Strain: Not on file  Food Insecurity: Not on file  Transportation Needs: Not on file  Physical Activity: Not on file  Stress: Not on file  Social Connections: Not on file  Intimate Partner Violence: Not on file       Objective:    BP 120/82 (BP Location: Left Arm, Patient Position: Sitting)   Pulse 74   Temp 97.8 F (36.6 C) (Temporal)   Ht 4' 11.25 (1.505 m)   Wt 141 lb 6.4 oz (64.1 kg)   LMP 01/28/2020   SpO2 99%   BMI 28.32 kg/m    Physical Exam Vitals and nursing note reviewed. Exam conducted with a chaperone present Epifania Olden, CMA).  Constitutional:      General: She is not in acute distress.    Appearance: Normal appearance. She is normal weight.  HENT:  Head: Normocephalic and atraumatic.     Right Ear: External ear normal.     Left Ear: External ear normal.     Nose: Nose normal.     Mouth/Throat:     Mouth: Mucous membranes are moist.     Pharynx: Oropharynx is clear.  Eyes:     Extraocular Movements: Extraocular movements intact.     Pupils: Pupils are equal, round, and reactive to light.  Cardiovascular:     Rate and Rhythm: Normal rate and regular rhythm.     Pulses: Normal pulses.     Heart sounds: Normal heart sounds.  Pulmonary:     Effort: Pulmonary effort is normal. No respiratory distress.     Breath sounds: Normal breath sounds. No wheezing, rhonchi or rales.  Genitourinary:     Comments: Area of mild erythema, mild tenderness  Musculoskeletal:        General: Normal range of motion.     Cervical back: Normal  range of motion.     Right lower leg: No edema.     Left lower leg: No edema.  Lymphadenopathy:     Cervical: No cervical adenopathy.  Neurological:     General: No focal deficit present.     Mental Status: She is alert and oriented to person, place, and time.  Psychiatric:        Mood and Affect: Mood normal.        Thought Content: Thought content normal.     No results found for any visits on 01/23/24.      Assessment & Plan:   Assessment and Plan Assessment & Plan Chronic constipation, grade 1 hemorrhoids Hemorrhoids with rectal bleeding and constipation. One hemorrhoid larger and more irritated. Bleeding not excessive. Constipation contributing to irritation. - Prescribed Colace and Miralax for stool softening. - Advised increased fluid intake. - Recommended Preparation H for hemorrhoid swelling and irritation.  Need for shingles vaccine Due for shingles vaccine as she is over 50. Aware of potential side effects. Advised side effects should resolve within a day or two, allowing travel. - Administered initial dose of shingles vaccine. - Scheduled second dose for any time after four months.     Orders Placed This Encounter  Procedures   Zoster, Recombinant (Shingrix)     No orders of the defined types were placed in this encounter.   Return in about 4 months (around 05/25/2024) for Shingles vaccine.  Corean LITTIE Ku, FNP

## 2024-01-23 NOTE — Patient Instructions (Addendum)
 Welcome to [facility]!  Thank you for choosing us  for your Primary Care needs.   We offer in person and video appointments for your convenience. You may call our office to schedule appointments, or you may schedule appointments with me through MyChart.   The best way to get in contact with me is via MyChart message. This will get to me faster than a phone call, unless there is an emergency, then please call 911.  The lab is located downstairs in the Sports Medicine building, we also have xray available there.   May use Preparation H or Anusol I can send to the pharmacy twice a day topically to hemorrhoids as needed.  May use 1 capful of MiraLAX and warm liquids once daily as needed for constipation.  May take Colace or other stool softener once or twice a day as needed for constipation  Follow-up with me for new or worsening symptoms.  We have given your first shingles vaccine today.  May come back in about 4 months for your second shingles vaccine

## 2024-03-13 ENCOUNTER — Encounter: Payer: Self-pay | Admitting: Dermatology

## 2024-03-13 ENCOUNTER — Ambulatory Visit: Admitting: Dermatology

## 2024-03-13 DIAGNOSIS — L811 Chloasma: Secondary | ICD-10-CM | POA: Diagnosis not present

## 2024-03-13 MED ORDER — SAFETY SEAL MISCELLANEOUS MISC: 4 refills | Status: AC

## 2024-03-13 NOTE — Patient Instructions (Addendum)
 VISIT SUMMARY:  Today, we discussed your ongoing treatment for skin pigmentation issues. You have been using a strong lightening cream, hydroquinone, for the past four months, which has lightened your skin but also affected your background color. We reviewed your current regimen and made some adjustments to your treatment plan.  YOUR PLAN:  -MELASMA:  Melasma is a common skin condition that causes brown or gray-brown patches on the skin, often due to sun exposure or hormonal changes.   You have shown improvement using hydroquinone, but some pigment remains too deep for topical treatment alone. To prevent potential side effects from prolonged use, we are discontinuing hydroquinone for four months.   Continue using Radiant Tone cream every morning and night until May. We are adding a salicylic acid product to help with exfoliation without over-drying your skin. Consider professional treatments like chemical peels and microneedling for further pigment reduction.   Use sunscreen daily, and we recommend the Centella brand. We will resume hydroquinone in early May after a four-month break.  INSTRUCTIONS:  Please follow up in early May to reassess your skin condition and determine if we should resume hydroquinone treatment. Continue using Radiant Tone cream and start using the salicylic acid product as directed. Make sure to use sunscreen daily, preferably the Centella brand.         Important Information   Due to recent changes in healthcare laws, you may see results of your pathology and/or laboratory studies on MyChart before the doctors have had a chance to review them. We understand that in some cases there may be results that are confusing or concerning to you. Please understand that not all results are received at the same time and often the doctors may need to interpret multiple results in order to provide you with the best plan of care or course of treatment. Therefore, we ask that  you please give us  2 business days to thoroughly review all your results before contacting the office for clarification. Should we see a critical lab result, you will be contacted sooner.     If You Need Anything After Your Visit   If you have any questions or concerns for your doctor, please call our main line at [phone]. If no one answers, please leave a voicemail as directed and we will return your call as soon as possible. Messages left after 4 pm will be answered the following business day.    You may also send us  a message via MyChart. We typically respond to MyChart messages within 1-2 business days.  For prescription refills, please ask your pharmacy to contact our office. Our fax number is [phone].  If you have an urgent issue when the clinic is closed that cannot wait until the next business day, you can page your doctor at the number below.     Please note that while we do our best to be available for urgent issues outside of office hours, we are not available 24/7.    If you have an urgent issue and are unable to reach us , you may choose to seek medical care at your doctor's office, retail clinic, urgent care center, or emergency room.   If you have a medical emergency, please immediately call 911 or go to the emergency department. In the event of inclement weather, please call our main line at [phone] for an update on the status of any delays or closures.  Dermatology Medication Tips: Please keep the boxes that topical medications come in in order  to help keep track of the instructions about where and how to use these. Pharmacies typically print the medication instructions only on the boxes and not directly on the medication tubes.   If your medication is too expensive, please contact our office at [phone] or send us  a message through MyChart.    We are unable to tell what your co-pay for medications will be in advance as this is different depending on your  insurance coverage. However, we may be able to find a substitute medication at lower cost or fill out paperwork to get insurance to cover a needed medication.    If a prior authorization is required to get your medication covered by your insurance company, please allow us  1-2 business days to complete this process.   Drug prices often vary depending on where the prescription is filled and some pharmacies may offer cheaper prices.   The website www.goodrx.com contains coupons for medications through different pharmacies. The prices here do not account for what the cost may be with help from insurance (it may be cheaper with your insurance), but the website can give you the price if you did not use any insurance.  - You can print the associated coupon and take it with your prescription to the pharmacy.  - You may also stop by our office during regular business hours and pick up a GoodRx coupon card.  - If you need your prescription sent electronically to a different pharmacy, notify our office through [facility] or by phone at [phone]

## 2024-03-13 NOTE — Progress Notes (Signed)
" ° °  Follow-Up Visit   Subjective  Paige Wise is a 48 y.o. female who presents for the following: Melasma  Patient present today for follow up visit for Melasma. Patient was last evaluated on 11/09/2023. At this visit patient was prescribed Hydroquinone 12%/ Kojic Acid with Eucerin Radiant Tone. She is also applying sunscreen daily. Patient reports sxs are improving. Patient denies medication changes.  Patient provided verbal consent for the use of an AI-assisted program to generate a detailed after-visit summary. The patient understands that the AI tool is used to support clinical documentation and that all information will be reviewed and verified by the healthcare provider.   The following portions of the chart were reviewed this encounter and updated as appropriate: medications, allergies, medical history  Review of Systems:  No other skin or systemic complaints except as noted in HPI or Assessment and Plan.  Objective  Well appearing patient in no apparent distress; mood and affect are within normal limits.  A focused examination was performed of the following areas: Face  Relevant exam findings are noted in the Assessment and Plan.          Assessment & Plan   MELASMA Exam: reticulated hyperpigmented patches at face  Not at goal  Melasma is a chronic; persistent condition of hyperpigmented patches generally on the face, worse in summer due to higher UV exposure.    Heredity; thyroid  disease; sun exposure; pregnancy; birth control pills; epilepsy medication and darker skin may predispose to Melasma.   Recommendations include: - Sun avoidance and daily broad spectrum (UVA/UVB) tinted mineral sunscreen SPF 30+, with Zinc or Titanium Dioxide. -Treatment Plan: - Recommend daily broad spectrum (UVA/UVB) tinted mineral sunscreen SPF 30+, with Zinc or Titanium Dioxide. - Recommended continuing Eucerin Radiant Tone  - Recommended applying LRP SA leave on Solution - Advised  to research Micro-Needling and chemical Peels - Plan to follow up in October   Return in about 10 months (around 01/11/2025) for Melasma F/U.  I, Jetta Ager, am acting as neurosurgeon for [employer], DO.  Documentation: I have reviewed the above documentation for accuracy and completeness, and I agree with the above.  Delon Lenis, DO   "

## 2024-05-25 ENCOUNTER — Ambulatory Visit
